# Patient Record
Sex: Male | Born: 1970
Health system: Southern US, Community
[De-identification: ages and names within clinical notes are randomized; demographics above are authoritative.]

## PROBLEM LIST (undated history)

## (undated) DIAGNOSIS — M549 Dorsalgia, unspecified: Secondary | ICD-10-CM

## (undated) DIAGNOSIS — R030 Elevated blood-pressure reading, without diagnosis of hypertension: Secondary | ICD-10-CM

## (undated) DIAGNOSIS — G8929 Other chronic pain: Secondary | ICD-10-CM

## (undated) HISTORY — DX: Dorsalgia, unspecified: M54.9

## (undated) HISTORY — DX: Other chronic pain: G89.29

## (undated) HISTORY — DX: Elevated blood-pressure reading, without diagnosis of hypertension: R03.0

---

## 1998-07-14 ENCOUNTER — Emergency Department (HOSPITAL_COMMUNITY): Admission: EM | Admit: 1998-07-14 | Discharge: 1998-07-14 | Payer: Self-pay | Admitting: Emergency Medicine

## 1998-07-14 ENCOUNTER — Encounter: Admission: RE | Admit: 1998-07-14 | Discharge: 1998-07-14 | Payer: Self-pay | Admitting: *Deleted

## 1998-07-15 ENCOUNTER — Encounter: Admission: RE | Admit: 1998-07-15 | Discharge: 1998-10-13 | Payer: Self-pay | Admitting: Internal Medicine

## 2005-09-15 ENCOUNTER — Ambulatory Visit: Payer: Self-pay | Admitting: Internal Medicine

## 2005-09-29 ENCOUNTER — Ambulatory Visit: Payer: Self-pay | Admitting: Internal Medicine

## 2006-06-16 ENCOUNTER — Ambulatory Visit: Payer: Self-pay | Admitting: Cardiology

## 2006-06-16 ENCOUNTER — Inpatient Hospital Stay (HOSPITAL_COMMUNITY): Admission: EM | Admit: 2006-06-16 | Discharge: 2006-06-17 | Payer: Self-pay | Admitting: Emergency Medicine

## 2006-07-02 ENCOUNTER — Encounter: Payer: Self-pay | Admitting: Cardiology

## 2006-07-02 ENCOUNTER — Ambulatory Visit: Payer: Self-pay

## 2006-08-02 ENCOUNTER — Ambulatory Visit: Payer: Self-pay | Admitting: Cardiology

## 2007-06-25 ENCOUNTER — Ambulatory Visit: Payer: Self-pay | Admitting: Cardiology

## 2011-05-09 NOTE — Assessment & Plan Note (Signed)
[facility]                            CARDIOLOGY OFFICE NOTE   Eric Rios, Eric Rios                         MRN:          [id number]  DATE:06/25/2007                            DOB:          05-02-1971    PRIMARY CARE:  [facility].   REASON FOR VISIT:  Followup atrial fibrillation.   HISTORY OF PRESENT ILLNESS:  I saw Eric Rios back in August of last  year.  He has a history of a single episode of atrial fibrillation with  rapid ventricular response following an intake of significant  caffeinated beverage.  He converted with flecainide during a hospital  observation, and has been followed on beta blocker therapy and aspirin  since that time.  His CHADS score is essentially zero.  He has not had  any furthers palpitations, states that he has been doing well, except  that he hurt his ankle playing basketball several months ago.  He has  been somewhat limited by this.  His electrocardiogram today shows normal  sinus rhythm at 76 beats per minute.   ALLERGIES:  No known drug allergies.   PRESENT MEDICATIONS:  1. Aspirin 81 mg 2 tablets p.o. daily.  2. Atenolol 50 mg p.o. daily.   REVIEW OF SYSTEMS:  As described in the history of present illness.   PHYSICAL EXAMINATION:  Blood pressure is 133/81, heart rate is 85,  weight is 265 pounds.  The patient is overweight, no acute distress.  Examination of the neck reveals no elevated jugular venous pressure,  loud bruits, no thyromegaly is noted.  LUNGS:  Clear without labored breathing at rest.  CARDIAC EXAM:  Reveals a regular rate and rhythm without loud murmur or  gallop.  EXTREMITIES:  Exhibit no significant pitting edema.   IMPRESSION:  1. Episode of atrial fibrillation with rapid ventricular response as      outlined.  He has had no obvious symptomatic recurrences of present      therapy, and we will plan to continue basic symptom observation      with a yearly visit.  I have  already spoken with him about diet,      weight loss, and avoiding caffeine use.  2. Otherwise continue regular followup with [facility].     Jonelle Sidle, MD  Electronically Signed    SGM/MedQ  DD: 06/25/2007  DT: 06/26/2007  Job #: [id number]

## 2011-05-12 NOTE — Discharge Summary (Signed)
NAME:  Eric Rios, Eric Rios NO.:  [id number]   MEDICAL RECORD NO.:  [id number]          PATIENT TYPE:  INP   LOCATION:  2902                         FACILITY:  [facility]   PHYSICIAN:  Gene Serpe, P.A. [facility]   DATE OF BIRTH:  Jun 08, 1971   DATE OF ADMISSION:  06/16/2006  DATE OF DISCHARGE:  06/17/2006                                 DISCHARGE SUMMARY   PRINCIPAL DIAGNOSES:  1.  Paroxysmal atrial fibrillation with rapid ventricular response.      Successfully chemically converted with flecainide.  2.  Preserved left ventricular function.   SECONDARY DIAGNOSES:  1.  History of palpitations.  2.  History of hypercholesterolemia.  3.  Obesity.   PROCEDURES:  None.   REASON FOR ADMISSION:  [name] is a 40 year old male, with no known  cardiac history, who presented to the emergency room with complaints of mild  exertional dyspnea. He initially presented to Urgent Care where he was found  to be in paroxysmal atrial fibrillation with RVR and was treated with  Diltiazem bolus prior to transfer.  He was subsequently transferred to [facility] ER for further evaluation and management.   HOSPITAL COURSE:  While in the emergency room, the patient was evaluated  with a bedside echocardiogram revealing preserved left ventricular function.  Dr. Andee Lineman thus recommended proceeding initially with rate control with  Lopressor 25 mg followed 30 minutes later by a 300 mg dose of flecainide.  The patient was subsequently successfully converted to a normal sinus rhythm  wherein her remained by time of discharge the following day.   The patient was thus cleared for discharge with recommendations to be  treated with Toprol XL 50 daily and aspirin.   The patient will need a followup formal 2D echocardiogram and aspirin.  Dr.  Andee Lineman did note that patient could be suitable candidate for treatment with  pill in the pocket in the future with flecainide, but recommended  deferring this  decision pending his followup.   Arrangements will be made for patient to establish with Dr. Nona Dell.  This will be preceded by formal 2D echocardiogram in our office in our  office.   LABORATORY DATA:  Normal CBC.  INR 1.0 on admission.  Sodium 138.  Potassium  3.9.  BUN 9.  Creatinine 0.9.  Glucose 91.  Normal liver enzymes.  One set  cardiac markers normal.  Lipid profile:  Total cholesterol 171,  triglycerides 170, HDL 34, LDL 103.  TSH 0.85.   Admission chest x-ray:  Probable small bilateral effusions with mild  cardiomegaly and vascular congestion.   DISCHARGE MEDICATIONS:  1.  Toprol XL 50 mg daily.  2.  Coated aspirin 325 mg daily.   INSTRUCTIONS:  The patient may return to work on Wednesday, June 20, 2006.   FOLLOWUP:  The patient will establish with Dr. Nona Dell in the office  in approximately 2 weeks.  This will be preceded by formal 2D echocardiogram  with arrangements to be made through our office.   DISPOSITION:  Stable.   DICTATION DURATION:  35 minutes.  Gene Serpe, P.A. [facility]     GS/MEDQ  D:  06/17/2006  T:  06/17/2006  Job:  [id number]   cc:   [facility]  [address] [place], [place] [address]

## 2011-05-12 NOTE — Assessment & Plan Note (Signed)
[facility]                              CARDIOLOGY OFFICE NOTE   AMERY, MINASYAN                         MRN:          [id number]  DATE:08/02/2006                            DOB:          1971/01/10    PRIMARY CARE PHYSICIAN:  [facility].   REASON FOR VISIT:  Follow up atrial fibrillation.   HISTORY OF PRESENT ILLNESS:  This is my first meeting with Mr. Heady.  He is  a pleasant 40 year old male admitted to the hospital by Dr. Andee Lineman back on  June 16, 2006, having presented with atrial fibrillation with rapid  ventricular response.  At that time, he was drinking approximately four to  five 20 ounce caffeinated beverages a day.  He was treated initially with  rate control, and ultimately received a single dose of Flecainide with  subsequent conversion to normal sinus rhythm.  He was placed on Toprol XL  and aspirin and discharged home on June 17, 2006.  His planned return to  work was June 20, 2006.  During his hospital stay, he had a normal TSH level  of 0.847, and a LDL cholesterol of 103.  His cardiac markers were normal.  He states that since discharge he has returned to work without any  difficulty and has completely cut out caffeinated beverages.  He is not  experiencing any symptoms of palpitations, breathlessness, or chest pain.  His electrocardiogram today was normal showing sinus rhythm at 66 beats per  minute.  We discussed atrial fibrillation today and I basically made him  aware of recurrent symptoms and encouraged him to consider a basic exercise  regimen, as well as continue to avoid caffeine and other stimulants.  He did  have a followup echocardiogram after discharge which revealed normal left  ventricular systolic function with an ejection fraction of 55 to 60%, no  regional wall motion abnormalities, and mildly dilated left atrium with no  major valvular disease.   ALLERGIES:  No known drug allergies.   PRESENT MEDICATIONS:  1. Aspirin 81 mg two tablets p.o. daily.  2. Atenolol 50 mg p.o. daily in lieu of the Toprol XL 50 mg daily which he      did not tolerate.   REVIEW OF SYSTEMS:  As described in history of present illness.  Otherwise  negative.   PHYSICAL EXAMINATION:  VITAL SIGNS:  Blood pressure 152/92, heart rate 82,  weight 259 pounds.  GENERAL:  The patient is obese, in no acute distress.  NECK:  No elevated jugular venous pressure, no bruits, no thyromegaly or  thyroid tenderness is noted.  LUNGS:  Clear without labored breathing.  CARDIAC:  Regular rate and rhythm without loud murmur or gallop.  There is  no abdominal bruit noted.  EXTREMITIES:  No pitting edema.   IMPRESSION AND RECOMMENDATIONS:  1. History of atrial fibrillation with rapid ventricular response.  He has      had no obvious symptomatic recurrence and will be maintained on beta      blocker therapy and aspirin for the time being.  His  left ventricular      function is normal and he has no major valvular disease, no thyroid      disease, diabetes mellitus, or other thrombo-embolic risk factors.  I      have encouraged him to adopt a basic exercise regimen, work on weight      loss, and avoid caffeine and other stimulants.  If he has recurrent      atrial fibrillation, we will likely need to consider a different      approach.  I will plan to see him back over the next six months with      anticipated followup otherwise via his primary care [facility].  2. Further plans to follow.                                Jonelle Sidle, MD    SGM/MedQ  DD:  08/02/2006  DT:  08/02/2006  Job #:  [id number]   cc:   [facility]

## 2011-05-12 NOTE — H&P (Signed)
NAME:  Eric Rios, Eric Rios NO.:  [id number]   MEDICAL RECORD NO.:  [id number]          PATIENT TYPE:  EMS   LOCATION:  MAJO                         FACILITY:  [facility]   PHYSICIAN:  Learta Codding, M.D. LHCDATE OF BIRTH:  08/16/1971   DATE OF ADMISSION:  06/16/2006  DATE OF DISCHARGE:                                HISTORY & PHYSICAL   REASON FOR ADMISSION:  Mr. Eric Rios is a 40 year old male with no prior cardiac  history who now presents to the ER with new onset paroxysmal atrial  fibrillation with rapid ventricular response.   The patient reports having some mild dyspnea which began approximately 6  p.m. yesterday.  This was after playing baseball with his son yesterday  afternoon, however, his dyspnea persisted throughout the night and when he  awoke this morning, he continued to feel bad.  Although he denied any  frank tachy palpitations, he does describe a sensation overlying his left  precordium and also points out history of fluttering which has occurred on  several occasions over the past several years.  However, he denies any  increase in frequency of this pattern.  The patient initially presented to  an urgent care clinic this morning, where he was found to be in PAF at a  rate of 128 BPM.  He was treated with 50 mg of IV diltiazem as well as 2  baby aspirin and sublingual nitroglycerin for complaints of chest pain.  He  was then transferred here via EMS.  On arrival, repeat EKG demonstrated  atrial fibrillation at a lower rate of 109 BPM.  Systolic blood pressure was  99 on arrival.  The patient reports being quite fond of chocolate and also  imbibes at least four 20-ounce containers of [facility] and does so on a  regular basis.  However, he denies drinking regular tea or coffee.   ALLERGIES:  No known drug allergies.   MEDICATIONS:  None.   PAST MEDICAL HISTORY:  1.  History of hypercholesterolemia.  2.  The patient reports undergoing a routine treadmill  test about 10 years      ago for evaluation of nonexertional chest pain with test reportedly      negative.   The patient denies any hospitalizations or surgeries.   SOCIAL HISTORY:  The patient lives in [place] with his wife.  They have 3  children.  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  He has  never smoked tobacco and denies alcohol use.   FAMILY HISTORY:  Mother in her 80s, has had a myocardial infarction in the  past in her 17s.  Father deceased age 61, succumbed to cancer.  He had no  known heart disease.  Three sisters, none of whom have heart disease.   REVIEW OF SYSTEMS:  The patient denies any exertional chest discomfort or  recent exertional dyspnea.  He can easily climb a flight of stairs with no  associated symptoms.  He denies orthopnea, paroxysmal nocturnal dyspnea, or  lower extremity edema.  He has had a few episodes of fluttering over  these  past few years with no significant associated symptoms.  He has occasional  heartburn but does not take medication for this.  He denies any recent  fever, chills, sweats, or productive cough.  Denies dysuria, hematuria,  hemoptysis, hematochezia, or melena.  He denies any history of thyroid  disease.  Remaining systems negative.   PHYSICAL EXAMINATION:  VITAL SIGNS:  Blood pressure 99/53 on admission,  temperature 98.7, pulse 80 irregular, respirations 22, sat is 98% on 2  liters.  GENERAL:  A 40 year old male in no apparent distress.  HEENT:  Normocephalic atraumatic.  NECK:  Palpable carotid pulses without bruits.  LUNGS:  Clear to auscultation all fields.  HEART:  Regular rate and rhythm (S1 S2).  No murmurs, rubs, or gallops.  ABDOMEN:  Protuberant, nontender with intact bowel sounds.  EXTREMITIES:  Palpable distal pulses without edema.  NEUROLOGIC:  No focal deficit.   ADMISSION CHEST X-RAY:  Pending.   ADMISSION ELECTROCARDIOGRAM:  Atrial fibrillation at 109 BPM with normal  axis, nonspecific  changes.   LABORATORY DATA:  WBC 10, hemoglobin 15, hematocrit 46, platelets 201.  MB  less than 1.0, troponin I less than 0.05.   IMPRESSION:  1.  Paroxysmal atrial fibrillation with a rapid ventricular response of less      than 24 hour duration.  2.  History of palpitations.  3.  Significant caffeine intake.  4.  History of hypercholesterolemia.  5.  Obesity.   PLAN:  1.  The patient will be admitted to the stepdown unit for close monitoring      and management of atrial fibrillation.  2.  We feel that the onset of his arrhythmia was at 6 p.m. last evening,      when he became short of breath and this has persisted since then.  We      will initiate medical therapy here in the ER with Lopressor 25 t.i.d.      with initial dose to be given now.  This will be followed 30 minutes      later by a 300 mg dose of flecainide with close monitoring for      development of arrhythmia.  Hopefully, this will convert him to normal      sinus rhythm and the patient could be discharged home in the morning.  A      bedside echocardiogram was done and reviewed by Dr. Andee Lineman suggesting      normal left ventricular function.  The patient will need a followup      formal 2D echocardiogram in our office.  Anticoagulation will be deferred unless the patient fails to convert and we  will place him on full dose aspirin here in the ER.  1.  In addition, to complete blood work, including a magnesium level, we      will also check a thyroid level and a fasting lipid profile for the      morning.  2.  The patient will be placed on a caffeine restricted diet as well.      Gene Serpe, P.A. [facility]      Learta Codding, M.D. [facility]  Electronically Signed    GS/MEDQ  D:  06/16/2006  T:  06/16/2006  Job:  [id number]   cc:   [facility]

## 2015-12-13 ENCOUNTER — Ambulatory Visit: Payer: Self-pay | Admitting: Primary Care

## 2016-01-03 ENCOUNTER — Ambulatory Visit: Payer: Self-pay | Admitting: Primary Care

## 2016-05-15 ENCOUNTER — Ambulatory Visit (INDEPENDENT_AMBULATORY_CARE_PROVIDER_SITE_OTHER): Payer: Managed Care, Other (non HMO) | Admitting: Primary Care

## 2016-05-15 ENCOUNTER — Other Ambulatory Visit: Payer: Self-pay | Admitting: Primary Care

## 2016-05-15 ENCOUNTER — Encounter: Payer: Self-pay | Admitting: Primary Care

## 2016-05-15 VITALS — BP 146/98 | HR 83 | Temp 98.3°F | Ht 67.0 in | Wt 321.8 lb

## 2016-05-15 DIAGNOSIS — Z0000 Encounter for general adult medical examination without abnormal findings: Secondary | ICD-10-CM | POA: Diagnosis not present

## 2016-05-15 DIAGNOSIS — R030 Elevated blood-pressure reading, without diagnosis of hypertension: Secondary | ICD-10-CM

## 2016-05-15 DIAGNOSIS — Z23 Encounter for immunization: Secondary | ICD-10-CM | POA: Diagnosis not present

## 2016-05-15 DIAGNOSIS — M545 Low back pain, unspecified: Secondary | ICD-10-CM | POA: Insufficient documentation

## 2016-05-15 DIAGNOSIS — R5383 Other fatigue: Secondary | ICD-10-CM | POA: Diagnosis not present

## 2016-05-15 DIAGNOSIS — E119 Type 2 diabetes mellitus without complications: Secondary | ICD-10-CM

## 2016-05-15 DIAGNOSIS — Z6841 Body Mass Index (BMI) 40.0 and over, adult: Secondary | ICD-10-CM

## 2016-05-15 DIAGNOSIS — Z0001 Encounter for general adult medical examination with abnormal findings: Secondary | ICD-10-CM | POA: Insufficient documentation

## 2016-05-15 DIAGNOSIS — IMO0001 Reserved for inherently not codable concepts without codable children: Secondary | ICD-10-CM | POA: Insufficient documentation

## 2016-05-15 LAB — COMPREHENSIVE METABOLIC PANEL
ALT: 18 U/L (ref 0–53)
AST: 18 U/L (ref 0–37)
Albumin: 4.1 g/dL (ref 3.5–5.2)
Alkaline Phosphatase: 61 U/L (ref 39–117)
BUN: 8 mg/dL (ref 6–23)
CALCIUM: 9.4 mg/dL (ref 8.4–10.5)
CHLORIDE: 99 meq/L (ref 96–112)
CO2: 32 meq/L (ref 19–32)
CREATININE: 0.8 mg/dL (ref 0.40–1.50)
GFR: 110.32 mL/min (ref >60.00)
Glucose, Bld: 127 mg/dL — ABNORMAL HIGH (ref 70–99)
Potassium: 4.1 mEq/L (ref 3.5–5.1)
Sodium: 137 mEq/L (ref 135–145)
Total Bilirubin: 0.4 mg/dL (ref 0.2–1.2)
Total Protein: 7.2 g/dL (ref 6.0–8.3)

## 2016-05-15 LAB — LIPID PANEL
Cholesterol: 175 mg/dL (ref 0–200)
HDL: 40.1 mg/dL (ref >39.00)
LDL CALC: 112 mg/dL — AB (ref 0–99)
NONHDL: 135.14
Total CHOL/HDL Ratio: 4
Triglycerides: 115 mg/dL (ref 0.0–149.0)
VLDL: 23 mg/dL (ref 0.0–40.0)

## 2016-05-15 LAB — CBC
HEMATOCRIT: 43 % (ref 39.0–52.0)
Hemoglobin: 13.7 g/dL (ref 13.0–17.0)
MCHC: 31.8 g/dL (ref 30.0–36.0)
MCV: 74 fl — AB (ref 78.0–100.0)
PLATELETS: 246 10*3/uL (ref 150.0–400.0)
RBC: 5.81 Mil/uL (ref 4.22–5.81)
RDW: 14.8 % (ref 11.5–15.5)
WBC: 7.4 10*3/uL (ref 4.0–10.5)

## 2016-05-15 LAB — HEMOGLOBIN A1C: Hgb A1c MFr Bld: 8.9 % — ABNORMAL HIGH (ref 4.6–6.5)

## 2016-05-15 LAB — VITAMIN B12: Vitamin B-12: 259 pg/mL (ref 211–911)

## 2016-05-15 LAB — TSH: TSH: 1.95 u[IU]/mL (ref 0.35–4.50)

## 2016-05-15 MED ORDER — METFORMIN HCL 1000 MG PO TABS: ORAL_TABLET | ORAL | Status: DC

## 2016-05-15 NOTE — Assessment & Plan Note (Signed)
Td due, completed today. Poor diet and does not exercise. Long discussion today spent on the importance of a healthy diet and regular exercise in order for weight loss and to reduce risk of other medical diseases. Labs pending today. Exam unremarkable.  Follow up in 1 year for complete physical.

## 2016-05-15 NOTE — Progress Notes (Signed)
Subjective:    Patient ID: Eric Rios, male    DOB: September 24, 1971, 45 y.o.   MRN: [id number]  HPI  Mr. Dugar is a 45 year old male who presents today to establish care, discuss problems mentioned below, and for complete physical.  Immunizations: -Tetanus: Unsure, believes it's been over 10 years. Due. -Influenza: He did not complete last season.  Diet: He endorses a poor diet. Breakfast: Fast food, soda Lunch: Fast food Dinner: Sandwich, meat, vegetable, starch Snacks: Chips, crackers, candy Desserts: Daily Beverages: Sodas, some water, orange juice  Exercise: He is not currently exercising.  Eye exam: He's not completed in several years. Dental exam: He's not completed in several years.  1) Back Pain: Located to the lower back, mostly to his left lower side. 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 His pain is worse with prolonged standing. He doesn't believe he's had an xray in recent years. He eats a poor diet and does not exercise.  2) Fatigue: Present everyday. Daytime tiredness and wants to sleep during his days off. He is getting 8 hours of sleep on average every night. He is obese, falls asleep at work, snore at night. He is concerned he may have low testosterone levels. Denies family history of low testosterone levels, denies family history of OSA.  3) Elevated Blood Pressure Reading: Elevated in the clinic today at 146/98. He's checking his BP at work and is getting readings of 140's/80's on average. Denies chest pain and dizziness. He will get shortness of breath when walking upstairs. He has a family history of hypertension with his father. He endorses a poor diet and does not exercise.    Review of Systems  Constitutional: Positive for fatigue. Negative for unexpected weight change.  HENT: Negative for rhinorrhea.   Respiratory: Negative for cough.   Cardiovascular: Negative for chest pain.  Gastrointestinal:  Negative for diarrhea and constipation.  Genitourinary: Negative for difficulty urinating.  Musculoskeletal: Negative for myalgias and arthralgias.  Skin: Negative for rash.  Allergic/Immunologic: Negative for environmental allergies.  Neurological: Negative for dizziness, numbness and headaches.  Psychiatric/Behavioral:       Denies concerns for anxiety or depression.        Past Medical History  Diagnosis Date  . Elevated blood pressure reading   . Chronic back pain      Social History   Social History  . Marital Status: Married    Spouse Name: N/A  . Number of Children: N/A  . Years of Education: N/A   Occupational History  . Not on file.   Social History Main Topics  . Smoking status: Never Smoker   . Smokeless tobacco: Not on file  . Alcohol Use: No  . Drug Use: No  . Sexual Activity: Not on file   Other Topics Concern  . Not on file   Social History Narrative   Married.   2 children.   Works as Lobbyist.   Enjoys playing basketball, spending time with family.     No past surgical history on file.  Family History  Problem Relation Age of Onset  . Hypertension Father   . Lung cancer Father     No Known Allergies  No current outpatient prescriptions on file prior to visit.   No current facility-administered medications on file prior to visit.    BP 146/98 mmHg  Pulse 83  Temp(Src) 98.3 F (36.8 C) (Oral)  Ht 5\' 7"  (1.702 m)  Wt 321 lb 12.8 oz (145.968 kg)  BMI 50.39 kg/m2  SpO2 94%    Objective:   Physical Exam  Constitutional: He is oriented to person, place, and time. He appears well-nourished.  Morbidly obese.  HENT:  Right Ear: Tympanic membrane and ear canal normal.  Left Ear: Tympanic membrane and ear canal normal.  Nose: Nose normal. Right sinus exhibits no maxillary sinus tenderness and no frontal sinus tenderness. Left sinus exhibits no maxillary sinus tenderness and no frontal sinus tenderness.  Mouth/Throat:  Oropharynx is clear and moist.  Eyes: Conjunctivae and EOM are normal. Pupils are equal, round, and reactive to light.  Neck: Neck supple. Carotid bruit is not present. No thyromegaly present.  Short neck  Cardiovascular: Normal rate, regular rhythm and normal heart sounds.   Pulmonary/Chest: Effort normal and breath sounds normal. He has no wheezes. He has no rales.  Abdominal: Soft. Bowel sounds are normal. There is no tenderness.  Musculoskeletal: Normal range of motion.  Neurological: He is alert and oriented to person, place, and time. He has normal reflexes. No cranial nerve deficit.  Skin: Skin is warm and dry.  Psychiatric: He has a normal mood and affect.          Assessment & Plan:

## 2016-05-15 NOTE — Assessment & Plan Note (Signed)
Present daily for years. Symptoms suggest this may likely be due to undiagnosed OSA. Referral to pulmonology placed for further evaluation. Will also check TSH, CBC, CMP, A1C, Lipids to rule out other metabolic causes.  Long discussion spent on the importance of a healthy diet and regular exercise as obesity places him at higher risk for OSA and other medical diseases.

## 2016-05-15 NOTE — Assessment & Plan Note (Signed)
Morbidly obese. Back pain to left side since lifting a 400+ pound man off of the ground several years ago. Straight leg raise negative. No radiculopathy. Likely due to weight, but will obtain xray for further evaluation. He is too large for our xray table, so he will have to get [facility] at [facility]. He verbalized understanding.

## 2016-05-15 NOTE — Patient Instructions (Signed)
You must complete your xray at [facility]. Stop by the front to learn where to go for this.  Complete lab work prior to leaving today. I will notify you of your results once received.   Your fatigue could be contributed to multiple causes; however, I suspect you have sleep apnea which is likely attributing. You will be contacted regarding your referral to Pulmonology for further evaluation of sleep apnea.  Please let us know if you have not heard back within one week.   I highly recommend you schedule an eye exam with a local optometrist.  Your blood pressure is slightly too high. This will come down with weight loss through improvements in your diet and exercise.  You MUST work to improve your diet. Obesity can place you at risk for diabetes, stroke, heart attack, and other chronic conditions.  Reduce: Sodas, fast food, junk food. Increase: Lean protein (baked/grilled), vegetables, whole grains.  You've been provided with a tetanus vaccination that will cover you for 10 years.  I will be in touch with you once I receive your lab and xray results.  It was a pleasure to meet you today! Please don't hesitate to call me with any questions. Welcome to [facility]!  DASH Eating Plan DASH stands for "Dietary Approaches to Stop Hypertension." The DASH eating plan is a healthy eating plan that has been shown to reduce high blood pressure (hypertension). Additional health benefits may include reducing the risk of type 2 diabetes mellitus, heart disease, and stroke. The DASH eating plan may also help with weight loss. WHAT DO I NEED TO KNOW ABOUT THE DASH EATING PLAN? For the DASH eating plan, you will follow these general guidelines:  Choose foods with a percent daily value for sodium of less than 5% (as listed on the food label).  Use salt-free seasonings or herbs instead of table salt or sea salt.  Check with your health care provider or pharmacist before using salt substitutes.  Eat  lower-sodium products, often labeled as "lower sodium" or "no salt added."  Eat fresh foods.  Eat more vegetables, fruits, and low-fat dairy products.  Choose whole grains. Look for the word "whole" as the first word in the ingredient list.  Choose fish and skinless chicken or [place] more often than red meat. Limit fish, poultry, and meat to 6 oz (170 g) each day.  Limit sweets, desserts, sugars, and sugary drinks.  Choose heart-healthy fats.  Limit cheese to 1 oz (28 g) per day.  Eat more home-cooked food and less restaurant, buffet, and fast food.  Limit fried foods.  Cook foods using methods other than frying.  Limit canned vegetables. If you do use them, rinse them well to decrease the sodium.  When eating at a restaurant, ask that your food be prepared with less salt, or no salt if possible. WHAT FOODS CAN I EAT? Seek help from a dietitian for individual calorie needs. Grains Whole grain or whole wheat bread. Brown rice. Whole grain or whole wheat pasta. Quinoa, bulgur, and whole grain cereals. Low-sodium cereals. Corn or whole wheat flour tortillas. Whole grain cornbread. Whole grain crackers. Low-sodium crackers. Vegetables Fresh or frozen vegetables (raw, steamed, roasted, or grilled). Low-sodium or reduced-sodium tomato and vegetable juices. Low-sodium or reduced-sodium tomato sauce and paste. Low-sodium or reduced-sodium canned vegetables.  Fruits All fresh, canned (in natural juice), or frozen fruits. Meat and Other Protein Products Ground beef (85% or leaner), grass-fed beef, or beef trimmed of fat. Skinless chicken or [place].  Ground chicken or [place]. Pork trimmed of fat. All fish and seafood. Eggs. Dried beans, peas, or lentils. Unsalted nuts and seeds. Unsalted canned beans. Dairy Low-fat dairy products, such as skim or 1% milk, 2% or reduced-fat cheeses, low-fat ricotta or cottage cheese, or plain low-fat yogurt. Low-sodium or reduced-sodium cheeses. Fats and  Oils Tub margarines without trans fats. Light or reduced-fat mayonnaise and salad dressings (reduced sodium). Avocado. Safflower, olive, or canola oils. Natural peanut or almond butter. Other Unsalted popcorn and pretzels. The items listed above may not be a complete list of recommended foods or beverages. Contact your dietitian for more options. WHAT FOODS ARE NOT RECOMMENDED? Grains White bread. White pasta. White rice. Refined cornbread. Bagels and croissants. Crackers that contain trans fat. Vegetables Creamed or fried vegetables. Vegetables in a cheese sauce. Regular canned vegetables. Regular canned tomato sauce and paste. Regular tomato and vegetable juices. Fruits Dried fruits. Canned fruit in light or heavy syrup. Fruit juice. Meat and Other Protein Products Fatty cuts of meat. Ribs, chicken wings, bacon, sausage, bologna, salami, chitterlings, fatback, hot dogs, bratwurst, and packaged luncheon meats. Salted nuts and seeds. Canned beans with salt. Dairy Whole or 2% milk, cream, half-and-half, and cream cheese. Whole-fat or sweetened yogurt. Full-fat cheeses or blue cheese. Nondairy creamers and whipped toppings. Processed cheese, cheese spreads, or cheese curds. Condiments Onion and garlic salt, seasoned salt, table salt, and sea salt. Canned and packaged gravies. Worcestershire sauce. Tartar sauce. Barbecue sauce. Teriyaki sauce. Soy sauce, including reduced sodium. Steak sauce. Fish sauce. Oyster sauce. Cocktail sauce. Horseradish. Ketchup and mustard. Meat flavorings and tenderizers. Bouillon cubes. Hot sauce. Tabasco sauce. Marinades. Taco seasonings. Relishes. Fats and Oils Butter, stick margarine, lard, shortening, ghee, and bacon fat. Coconut, palm kernel, or palm oils. Regular salad dressings. Other Pickles and olives. Salted popcorn and pretzels. The items listed above may not be a complete list of foods and beverages to avoid. Contact your dietitian for more  information. WHERE CAN I FIND MORE INFORMATION? National Heart, Lung, and Blood Institute: [employer]   This information is not intended to replace advice given to you by your health care provider. Make sure you discuss any questions you have with your health care provider.   Document Released: 11/30/2011 Document Revised: 01/01/2015 Document Reviewed: 10/15/2013 Elsevier Interactive Patient Education [employer]

## 2016-05-15 NOTE — Assessment & Plan Note (Signed)
Poor diet consisting of regular consumption of fast food and sodas. He does not exercise.  Long discussion spent on the importance of a healthy diet and regular exercise as obesity places him at higher risk for OSA, MI, CVA, and other medical diseases.  Information and handouts provided for improvements in diet. Will continue to monitor.

## 2016-05-15 NOTE — Progress Notes (Signed)
Pre visit review using our clinic review tool, if applicable. No additional management support is needed unless otherwise documented below in the visit note. 

## 2016-05-15 NOTE — Assessment & Plan Note (Signed)
Slightly above goal today, also with readings he's had at work. Discussed treatment options with him, and feel that it is reasonable for him to work on improvements in his diet to lower BP levels as they are only slightly above goal.  Will allow him 6 months to work on improvements, if no improvement will initiate treatment.

## 2016-08-07 ENCOUNTER — Ambulatory Visit (INDEPENDENT_AMBULATORY_CARE_PROVIDER_SITE_OTHER): Payer: Managed Care, Other (non HMO) | Admitting: Pulmonary Disease

## 2016-08-07 ENCOUNTER — Encounter (INDEPENDENT_AMBULATORY_CARE_PROVIDER_SITE_OTHER): Payer: Self-pay

## 2016-08-07 VITALS — BP 158/92 | HR 75

## 2016-08-07 DIAGNOSIS — G4710 Hypersomnia, unspecified: Secondary | ICD-10-CM | POA: Insufficient documentation

## 2016-08-07 DIAGNOSIS — G4733 Obstructive sleep apnea (adult) (pediatric): Secondary | ICD-10-CM

## 2016-08-07 DIAGNOSIS — E669 Obesity, unspecified: Secondary | ICD-10-CM | POA: Insufficient documentation

## 2016-08-07 NOTE — Progress Notes (Signed)
Subjective:    Patient ID: Eric Rios, male    DOB: 08/24/1971, 45 y.o.   MRN: [id number]  HPI   This is the case of Eric Rios, 45 y.o. Male, who was referred by Dr. Vernona RiegerKatherine Clark  in consultation regarding possible OSA.   As you very well know, patient is a non smoker. He is not dxed with asthma or copd. No history of CA or DVT.  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   Patient is here for worsening hypersomnia. Has had hypersomnia for 5 years, worse the last year. Hypersomnia affects his functionality. As mentioned, he works with machinery and sometimes in the afternoon, he gets sleepy. He has witnessed apneas, snoring, gasping, choking, unrefreshed sleep. He does frequent tossing and turning. During weekends, he would sometimes end up sleeping throughout the day and would still feels unrefreshed when he wakes up. Does sleep talking. No other abnormal behavior and sleep.    Review of Systems  Constitutional: Negative.  Negative for fever and unexpected weight change.  HENT: Positive for congestion, rhinorrhea and sneezing. Negative for dental problem, ear pain, nosebleeds, postnasal drip, sinus pressure, sore throat and trouble swallowing.   Eyes: Negative.  Negative for redness and itching.  Respiratory: Negative.  Negative for cough, chest tightness, shortness of breath and wheezing.   Cardiovascular: Negative.  Negative for palpitations and leg swelling.  Gastrointestinal: Negative.  Negative for nausea and vomiting.  Endocrine: Negative.   Genitourinary: Negative.  Negative for dysuria.  Musculoskeletal: Positive for arthralgias, back pain and neck pain. Negative for joint swelling.  Skin: Negative.  Negative for rash.  Allergic/Immunologic: Positive for environmental allergies.  Neurological: Positive for headaches.  Hematological: Negative.  Does not bruise/bleed easily.  Psychiatric/Behavioral: Negative.  Negative for dysphoric mood. The patient  is not nervous/anxious.    Past Medical History:  Diagnosis Date  . Chronic back pain   . Elevated blood pressure reading      Family History  Problem Relation Age of Onset  . Hypertension Father   . Lung cancer Father      No past surgical history on file. NO surgery.   Social History   Social History  . Marital status: Married    Spouse name: N/A  . Number of children: N/A  . Years of education: N/A   Occupational History  . Not on file.   Social History Main Topics  . Smoking status: Never Smoker  . Smokeless tobacco: Not on file  . Alcohol use No  . Drug use: No  . Sexual activity: Not on file   Other Topics Concern  . Not on file   Social History Narrative   Married.   2 children.   Works as Lobbyistfork lift driver.   Enjoys playing basketball, spending time with family.        [place]    No Known Allergies   Outpatient Medications Prior to Visit  Medication Sig Dispense Refill  . metFORMIN (GLUCOPHAGE) 1000 MG tablet Take 1/2 tablet by mouth twice daily for 2 weeks, then advance to 1 full tablet twice daily thereafter. (Patient not taking: Reported on 08/07/2016) 60 tablet 3   No facility-administered medications prior to visit.    No orders of the defined types were placed in this encounter.         Objective:   Physical Exam   Vitals:  Vitals:   08/07/16 1519  BP: (!) 158/92  Pulse: 75  SpO2: 94%    Constitutional/General:  Pleasant, well-nourished, well-developed, not in any distress,  Comfortably seating.  Well kempt  There is no height or weight on file to calculate BMI. Wt Readings from Last 3 Encounters:  05/15/16 (!) 321 lb 12.8 oz (146 kg)    Neck circumference: 19 inches  HEENT: Pupils equal and reactive to light and accommodation. Anicteric sclerae. Normal nasal mucosa.   No oral  lesions,  mouth clear,  oropharynx clear, no postnasal drip. (-) Oral thrush. No dental caries.  Airway - Mallampati class IV.   Neck:  No masses. Midline trachea. No JVD, (-) LAD. (-) bruits appreciated.  Respiratory/Chest: Grossly normal chest. (-) deformity. (-) Accessory muscle use.  Symmetric expansion. (-) Tenderness on palpation.  Resonant on percussion.  Diminished BS on both lower lung zones. (-) wheezing, crackles, rhonchi (-) egophony  Cardiovascular: Regular rate and  rhythm, heart sounds normal, no murmur or gallops, no peripheral edema  Gastrointestinal:  Normal bowel sounds. Soft, non-tender. No hepatosplenomegaly.  (-) masses.   Musculoskeletal:  Normal muscle tone. Normal gait.   Extremities: Grossly normal. (-) clubbing, cyanosis.  (-) edema  Skin: (-) rash,lesions seen.   Neurological/Psychiatric : alert, oriented to time, place, person. Normal mood and affect         Assessment & Plan:  Hypersomnia Patient is here for worsening hypersomnia. Has had hypersomnia for 5 years, worse the last year. Hypersomnia affects his functionality. As mentioned, he works with machinery and sometimes in the afternoon, he gets sleepy. He has witnessed apneas, snoring, gasping, choking, unrefreshed sleep. He does frequent tossing and turning. During weekends, he would sometimes end up sleeping throughout the day and would still feels unrefreshed when he wakes up. Does sleep talking. No other abnormal behavior and sleep.  ESS 21.  Hypersomnia affects his fxnality. He operates on fork lifts.    We discussed about the diagnosis of Obstructive Sleep Apnea (OSA) and implications of untreated OSA. We discussed about CPAP and BiPaP as possible treatment options.   We will schedule the patient for a sleep study. 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    Patient was instructed to call the office if he/she has not heard back from the office 1-2 weeks after the sleep study.   Patient was instructed to call the office if he/she is having issues with the PAP device.   We  discussed good sleep hygiene.   Patient was advised not to engage in activities requiring concentration and/or vigilance if he/she is sleepy.  Patient was advised not to drive if he/she is sleepy.   Anticipate no issues with cpap.  Advised pt not to operate on heavy equipment when sleepy. We will try to expedite getting sleep study and cpap machine. May end up needing bipap.    Obesity Weight reduction.     Thank you very much for letting me participate in this patient's care. Please do not hesitate to give me a call if you have any questions or concerns regarding the treatment plan.   Patient will follow up with me in 6-8 weeks.     Pollie MeyerJ. Angelo A. de Dios, MD  08/07/2016   5:10 PM Pulmonary and Critical Care Medicine [facility] Pager: [phone] Office: [phone], Fax: [phone]

## 2016-08-07 NOTE — Assessment & Plan Note (Addendum)
Patient is here for worsening hypersomnia. Has had hypersomnia for 5 years, worse the last year. Hypersomnia affects his functionality. As mentioned, he works with machinery and sometimes in the afternoon, he gets sleepy. He has witnessed apneas, snoring, gasping, choking, unrefreshed sleep. He does frequent tossing and turning. During weekends, he would sometimes end up sleeping throughout the day and would still feels unrefreshed when he wakes up. Does sleep talking. No other abnormal behavior and sleep.  ESS 21.  Hypersomnia affects his fxnality. He operates on fork lifts.    We discussed about the diagnosis of Obstructive Sleep Apnea (OSA) and implications of untreated OSA. We discussed about CPAP and BiPaP as possible treatment options.   We will schedule the patient for a sleep study. 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    Patient was instructed to call the office if he/she has not heard back from the office 1-2 weeks after the sleep study.   Patient was instructed to call the office if he/she is having issues with the PAP device.   We discussed good sleep hygiene.   Patient was advised not to engage in activities requiring concentration and/or vigilance if he/she is sleepy.  Patient was advised not to drive if he/she is sleepy.   Anticipate no issues with cpap.  Advised pt not to operate on heavy equipment when sleepy. We will try to expedite getting sleep study and cpap machine. May end up needing bipap.

## 2016-08-07 NOTE — Patient Instructions (Signed)
It was a pleasure taking care of you today!  We will schedule you to have a sleep study to determine if you have sleep apnea.   We will get a home sleep test.  You will be instructed to come back to the office to get an apparatus to sleep with overnight.  Once we have the apparatus, it will usually take us 1-2 weeks to read the study and get back at you with results of the test.  Please give us a call in 2 weeks after your study if you do not hear back from us.    If the sleep study is positive, we will order you a CPAP  machine.  Please call the office if you do NOT receive your machine in the next 1-2 weeks.   Please make sure you use your CPAP device everytime you sleep.  We will monitor the usage of your machine per your insurance requirement.  Your insurance company may take the machine from you if you are not using it regularly.   Please clean the mask, tubings, filter, water reservoir with soapy water every week.  Please use distilled water for the water reservoir.   Please call the office or your machine provider (DME company) if you are having issues with the device.   Return to clinic in 6-8 weeks.     

## 2016-08-07 NOTE — Assessment & Plan Note (Signed)
Weight reduction 

## 2016-08-21 ENCOUNTER — Ambulatory Visit: Payer: Managed Care, Other (non HMO) | Admitting: Primary Care

## 2016-08-21 DIAGNOSIS — G4733 Obstructive sleep apnea (adult) (pediatric): Secondary | ICD-10-CM | POA: Diagnosis not present

## 2016-08-22 ENCOUNTER — Telehealth: Payer: Self-pay | Admitting: Primary Care

## 2016-08-22 DIAGNOSIS — E119 Type 2 diabetes mellitus without complications: Secondary | ICD-10-CM

## 2016-08-22 NOTE — Telephone Encounter (Signed)
Patient did not come in for their appointment on [phone] for follow up .  Please let me know if patient needs to be contacted immediately for follow up or no follow up needed.

## 2016-08-22 NOTE — Telephone Encounter (Signed)
Please reschedule patient for follow up at his convenience. Also, I saw in his chart where he wasn't taking his Metformin, is that the case?

## 2016-08-23 ENCOUNTER — Telehealth: Payer: Self-pay | Admitting: Pulmonary Disease

## 2016-08-23 DIAGNOSIS — G4733 Obstructive sleep apnea (adult) (pediatric): Secondary | ICD-10-CM

## 2016-08-23 NOTE — Telephone Encounter (Signed)
[phone], pt cb

## 2016-08-23 NOTE — Telephone Encounter (Signed)
Noted  

## 2016-08-23 NOTE — Telephone Encounter (Signed)
Spoke with pt. He is aware of results. Order has been placed for CPAP. Pt will call back once he has started CPAP to make his ROV. Nothing further was needed.

## 2016-08-23 NOTE — Telephone Encounter (Signed)
    Please call the pt and tell the pt the HOME SLEEP STUDY  showed OSA.   Pt stops breathing  90  times an hour.   Home sleep study was done on : 08/21/16  Please order autoCPAP 5-15 cm H2O. Patient will need a mask fitting session. Patient will need a 2 week and a 1 month download.   Patient needs to be seen by me or any of the NPs/APPs  4-6 weeks after obtaining the cpap machine. Let me know if you receive this.   Thanks!   J. Alexis FrockAngelo A de Dios, MD 08/23/2016, 2:04 PM

## 2016-08-23 NOTE — Telephone Encounter (Signed)
LMTCB

## 2016-08-23 NOTE — Telephone Encounter (Signed)
Called and did get a hold of patient's spouse. Patient's spouse stated patient must have forgotten that he had two appointments on Monday. Patient's spouse will have patient call and reschedule appointment.  Patient's spouse stated that she believes patient is not taking anything. She will have patient call.

## 2016-08-24 ENCOUNTER — Telehealth: Payer: Self-pay | Admitting: Pulmonary Disease

## 2016-08-24 NOTE — Telephone Encounter (Signed)
Sp[facility] with [place] at [facility]. He is needing a copy of the pt's home sleep study. Johny DrillingChan had a copy of this test. It has been faxed to [place] at [phone]. Barbara CowerJason is aware of this. Nothing further was needed.

## 2016-08-25 ENCOUNTER — Other Ambulatory Visit: Payer: Self-pay | Admitting: *Deleted

## 2016-08-25 DIAGNOSIS — G4733 Obstructive sleep apnea (adult) (pediatric): Secondary | ICD-10-CM

## 2016-10-12 ENCOUNTER — Encounter: Payer: Self-pay | Admitting: Pulmonary Disease

## 2016-10-16 ENCOUNTER — Ambulatory Visit (INDEPENDENT_AMBULATORY_CARE_PROVIDER_SITE_OTHER): Payer: Managed Care, Other (non HMO) | Admitting: Pulmonary Disease

## 2016-10-16 ENCOUNTER — Encounter: Payer: Self-pay | Admitting: Pulmonary Disease

## 2016-10-16 DIAGNOSIS — G4733 Obstructive sleep apnea (adult) (pediatric): Secondary | ICD-10-CM | POA: Diagnosis not present

## 2016-10-16 NOTE — Addendum Note (Signed)
Addended by: Maisie FusGREEN, Lelia Jons M on: 10/16/2016 09:23 AM   Modules accepted: Orders

## 2016-10-16 NOTE — Assessment & Plan Note (Signed)
Home sleep test done August/28/2017: AHI 90. Auto CPAP 5-15 centimeters water. Started 08/2016.  Feels better using CPAP, more energy, less sleepiness. Some sleepiness but better. Feels benefit of cpap.  He operates a Chief Executive Officerforklift. Download the last month: 87%, AHI 10.  Plan :  We extensively discussed the importance of treating OSA and the need to use PAP therapy.   Continue with autocpap > will increase pressure to 5-20 cm water from 5-15 cm water as AHI still elevated at 10. If pt does not tolerate, he will call and mayebe will need a BipaP study if AHI does not improve in 2-3 months.    Patient was instructed to have mask, tubings, filter, reservoir cleaned at least once a week with soapy water.  Patient was instructed to call the office if he/she is having issues with the PAP device.    I advised patient to obtain sufficient amount of sleep --  7 to 8 hours at least in a 24 hr period.  Patient was advised to follow good sleep hygiene.  Patient was advised NOT to engage in activities requiring concentration and/or vigilance if he/she is and  sleepy.  Patient is NOT to drive if he/she is sleepy.

## 2016-10-16 NOTE — Progress Notes (Signed)
Subjective:    Patient ID: Eric Rios, male    DOB: [birth year], 45 y.o.   MRN: [id number]  HPI   This is the case of Eric ShipGeorge Rahal, 45 y.o. Male, who was referred by Dr. Vernona RiegerKatherine Clark  in consultation regarding possible OSA.   As you very well know, patient is a non smoker. He is not dxed with asthma or copd. No history of CA or DVT.  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   Patient is here for worsening hypersomnia. Has had hypersomnia for 5 years, worse the last year. Hypersomnia affects his functionality. As mentioned, he works with machinery and sometimes in the afternoon, he gets sleepy. He has witnessed apneas, snoring, gasping, choking, unrefreshed sleep. He does frequent tossing and turning. During weekends, he would sometimes end up sleeping throughout the day and would still feels unrefreshed when he wakes up. Does sleep talking. No other abnormal behavior and sleep.  ROV 10/16/16 Pt returns to office as follow-up on his sleep apnea. Since last seen, he had a home sleep test which showed AHI of 90 hour. He is on auto CPAP. Tolerating well. Feels better using it. More energy. Less sleepiness.  Review of Systems  Constitutional: Negative.  Negative for fever and unexpected weight change.  HENT: Positive for congestion, rhinorrhea and sneezing. Negative for dental problem, ear pain, nosebleeds, postnasal drip, sinus pressure, sore throat and trouble swallowing.   Eyes: Negative.  Negative for redness and itching.  Respiratory: Negative.  Negative for cough, chest tightness, shortness of breath and wheezing.   Cardiovascular: Negative.  Negative for palpitations and leg swelling.  Gastrointestinal: Negative.  Negative for nausea and vomiting.  Endocrine: Negative.   Genitourinary: Negative.  Negative for dysuria.  Musculoskeletal: Positive for arthralgias, back pain and neck pain. Negative for joint swelling.  Skin: Negative.  Negative for rash.    Allergic/Immunologic: Positive for environmental allergies.  Neurological: Positive for headaches.  Hematological: Negative.  Does not bruise/bleed easily.  Psychiatric/Behavioral: Negative.  Negative for dysphoric mood. The patient is not nervous/anxious.      Objective:   Physical Exam   Vitals:  Vitals:   10/16/16 0854  BP: 126/84  Pulse: 85  SpO2: 96%  Weight: (!) 324 lb 6.4 oz (147.1 kg)  Height: 5\' 7"  (1.702 m)    Constitutional/General:  Pleasant, well-nourished, well-developed, not in any distress,  Comfortably seating.  Well kempt  Body mass index is 50.81 kg/m. Wt Readings from Last 3 Encounters:  10/16/16 (!) 324 lb 6.4 oz (147.1 kg)  05/15/16 (!) 321 lb 12.8 oz (146 kg)    Neck circumference: 19 inches  HEENT: Pupils equal and reactive to light and accommodation. Anicteric sclerae. Normal nasal mucosa.   No oral  lesions,  mouth clear,  oropharynx clear, no postnasal drip. (-) Oral thrush. No dental caries.  Airway - Mallampati class IV.   Neck: No masses. Midline trachea. No JVD, (-) LAD. (-) bruits appreciated.  Respiratory/Chest: Grossly normal chest. (-) deformity. (-) Accessory muscle use.  Symmetric expansion. (-) Tenderness on palpation.  Resonant on percussion.  Diminished BS on both lower lung zones. (-) wheezing, crackles, rhonchi (-) egophony  Cardiovascular: Regular rate and  rhythm, heart sounds normal, no murmur or gallops, no peripheral edema  Gastrointestinal:  Normal bowel sounds. Soft, non-tender. No hepatosplenomegaly.  (-) masses.   Musculoskeletal:  Normal muscle tone. Normal gait.   Extremities: Grossly normal. (-) clubbing, cyanosis.  (-) edema  Skin: (-) rash,lesions seen.   Neurological/Psychiatric : alert, oriented to time, place, person. Normal mood and affect         Assessment & Plan:  OSA (obstructive sleep apnea) Home sleep test done August/28/2017: AHI 90. Auto CPAP 5-15 centimeters water. Started  08/2016.  Feels better using CPAP, more energy, less sleepiness. Some sleepiness but better. Feels benefit of cpap.  He operates a [employer]. Download the last month: 87%, AHI 10.  Plan :  We extensively discussed the importance of treating OSA and the need to use PAP therapy.   Continue with autocpap > will increase pressure to 5-20 cm water from 5-15 cm water as AHI still elevated at 10. If pt does not tolerate, he will call and mayebe will need a BipaP study if AHI does not improve in 2-3 months.    Patient was instructed to have mask, tubings, filter, reservoir cleaned at least once a week with soapy water.  Patient was instructed to call the office if he/she is having issues with the PAP device.    I advised patient to obtain sufficient amount of sleep --  7 to 8 hours at least in a 24 hr period.  Patient was advised to follow good sleep hygiene.  Patient was advised NOT to engage in activities requiring concentration and/or vigilance if he/she is and  sleepy.  Patient is NOT to drive if he/she is sleepy.   Obesity Weight reduction.   Return to clinic in 4 months.        Pollie Meyer, MD  10/16/2016   9:16 AM Pulmonary and Critical Care Medicine [facility] Pager: [phone] Office: [phone], Fax: [phone]

## 2016-10-16 NOTE — Patient Instructions (Signed)
  It was a pleasure taking care of you today!  Continue using your CPAP machine.  We will increase your cpap settings to 5-20 cm water. Please let us know if you do not tolerate this.  Please make sure you use your CPAP device everytime you sleep.  We will monitor the usage of your machine per your insurance requirement.  Your insurance company may take the machine from you if you are not using it regularly.   Please clean the mask, tubings, filter, water reservoir with soapy water every week.  Please use distilled water for the water reservoir.   Please call the office or your machine provider (DME company) if you are having issues with the device.   Return to clinic in 4 months.

## 2016-10-16 NOTE — Assessment & Plan Note (Signed)
Weight reduction 

## 2016-11-02 ENCOUNTER — Encounter: Payer: Self-pay | Admitting: Pulmonary Disease

## 2016-11-08 ENCOUNTER — Telehealth: Payer: Self-pay | Admitting: Pulmonary Disease

## 2016-11-08 NOTE — Telephone Encounter (Signed)
  DL from 16/1010/11- 96/0411/09 : 54%97%. AHI 8.2 from 10. We increased autocpap 5-20 from 5-15 cm water early Oct 2017 as AHI was 10.  Last 2 weeks DL > AHI < 10. Cont autocpap 5-20 cm water. Will get a DL for 09/811911/2017.  Observe trend.  If worse, will need bipap study.  Ashtyn : can we request for a DL for 1 month in 14/782911/2017?  Pls call pt and ask him if he is tolerating the autocpap 5-20 cm water. Let me know if he does not tolerate pressure.  Thanks.   Pollie MeyerJ. Angelo A de Dios, MD 11/08/2016, 8:40 AM [facility] Pager [phone] After 3 pm or if no answer, call [phone]

## 2016-11-08 NOTE — Telephone Encounter (Signed)
LMOMTCB x 1 

## 2016-11-13 NOTE — Telephone Encounter (Signed)
LMOM TCB x2

## 2016-11-20 ENCOUNTER — Telehealth: Payer: Self-pay | Admitting: Pulmonary Disease

## 2016-11-20 NOTE — Telephone Encounter (Signed)
LM x 1 

## 2016-11-20 NOTE — Telephone Encounter (Signed)
LMOM TCB x3  Unable to reach letter will be mailed if no call back is received today

## 2016-11-22 ENCOUNTER — Encounter: Payer: Self-pay | Admitting: Pulmonary Disease

## 2016-11-22 NOTE — Telephone Encounter (Signed)
lmomtcb x 2  

## 2016-11-23 NOTE — Telephone Encounter (Signed)
Unable to reach letter is placed

## 2016-11-24 NOTE — Telephone Encounter (Signed)
lmomtcb x 3  

## 2016-11-30 ENCOUNTER — Telehealth: Payer: Self-pay | Admitting: Pulmonary Disease

## 2016-11-30 NOTE — Telephone Encounter (Signed)
   Download for October 31 until 11/22/2016: 93%, AHI 7. On auto CPAP 5-20 centimeters water.  AHI trending down. Plan for a DL in Jan [id number].  Jasmine: Can  we request for a download in January 2018? Thanks.  Pollie MeyerJ. Angelo A de Dios, MD 11/30/2016, 1:17 PM  Pulmonary and Critical Care Pager [phone] After 3 pm or if no answer, call [phone]

## 2016-12-02 ENCOUNTER — Encounter: Payer: Self-pay | Admitting: Pulmonary Disease

## 2016-12-06 NOTE — Telephone Encounter (Signed)
[place] so I can do that. A reminder has been set. Nothing further at this time is needed

## 2016-12-22 ENCOUNTER — Telehealth: Payer: Self-pay | Admitting: Pulmonary Disease

## 2016-12-22 NOTE — Telephone Encounter (Signed)
lmom tcb x1  Please see AD message about his DL for his cpap

## 2016-12-22 NOTE — Telephone Encounter (Signed)
   DL for 1 month starting 57/8411/10 : 93%, AHI 6.3.  autocpap 5-20.  pls tell pt cpap is working. pls get a DL for Feb 2018 for 1 month. Thanks.  Pollie MeyerJ. Angelo A de Dios, MD 12/22/2016, 9:18 AM [facility] Pager [phone] After 3 pm or if no answer, call [phone]

## 2016-12-26 NOTE — Telephone Encounter (Signed)
lmomtcb x 2  

## 2017-01-03 NOTE — Telephone Encounter (Signed)
lmom tcb x3   Jose Alexis FrockAngelo A de Dios, MD 12 days ago      DL for 1 month starting 91/4711/10 : 93%, AHI 6.3.  autocpap 5-20.  pls tell pt cpap is working. pls get a DL for Feb 2018 for 1 month. Thanks.  Pollie MeyerJ. Angelo A de Dios, MD 12/22/2016, 9:18 AM [facility] Pager [phone] After 3 pm or if no answer, call [phone]

## 2017-01-04 NOTE — Telephone Encounter (Signed)
Unable to reach letter sent

## 2017-01-26 ENCOUNTER — Ambulatory Visit (INDEPENDENT_AMBULATORY_CARE_PROVIDER_SITE_OTHER): Payer: Managed Care, Other (non HMO) | Admitting: Internal Medicine

## 2017-01-26 ENCOUNTER — Encounter: Payer: Self-pay | Admitting: Internal Medicine

## 2017-01-26 VITALS — BP 136/84 | HR 94 | Temp 98.2°F | Wt 314.0 lb

## 2017-01-26 DIAGNOSIS — J111 Influenza due to unidentified influenza virus with other respiratory manifestations: Secondary | ICD-10-CM | POA: Diagnosis not present

## 2017-01-26 NOTE — Progress Notes (Signed)
Pre visit review using our clinic review tool, if applicable. No additional management support is needed unless otherwise documented below in the visit note. 

## 2017-01-26 NOTE — Assessment & Plan Note (Signed)
Clear clinical flu  4 days out and already improving Too late for tamiflu (gave Rx for prevention for wife) Discussed supportive care

## 2017-01-26 NOTE — Progress Notes (Signed)
   Subjective:    Patient ID: Eric Rios, male    DOB: 1971-12-21, 46 y.o.   MRN: [id number]  HPI Here due to respiratory illness  48 year old daughter diagnosed with flu 4 days ago Started with some head congestion, headache and rhinorrhea 3 days ago 2 days ago--got really bad. Couldn't even move out of bed Finally got here today--1st time he has been out of bed Still some headache, congestion, cough now Feels considerably better Fever gone over the past day Was terribly achy--but better now No SOB  No current outpatient prescriptions on file prior to visit.   No current facility-administered medications on file prior to visit.     No Known Allergies  Past Medical History:  Diagnosis Date  . Chronic back pain   . Elevated blood pressure reading     No past surgical history on file.  Family History  Problem Relation Age of Onset  . Hypertension Father   . Lung cancer Father     Social History   Social History  . Marital status: Married    Spouse name: N/A  . Number of children: N/A  . Years of education: N/A   Occupational History  . Not on file.   Social History Main Topics  . Smoking status: Never Smoker  . Smokeless tobacco: Never Used  . Alcohol use No  . Drug use: No  . Sexual activity: Not on file   Other Topics Concern  . Not on file   Social History Narrative   Married.   2 children.   Works as Lobbyistfork lift driver.   Enjoys playing basketball, spending time with family.    Review of Systems No rash 2 loose stools last night Eating some again--appetite had been off    Objective:   Physical Exam  Constitutional: No distress.  Looks washed out but no distress  HENT:  No sinus tenderness Mild pharyngeal injection without exudate Mild nasal inflammation TMs normal  Neck: No thyromegaly present.  Mild non tender anterior cervical nodes  Pulmonary/Chest: Effort normal and breath sounds normal. No respiratory distress. He has no wheezes.  He has no rales.  Skin: No rash noted.          Assessment & Plan:

## 2017-01-31 ENCOUNTER — Telehealth: Payer: Self-pay | Admitting: Pulmonary Disease

## 2017-01-31 NOTE — Telephone Encounter (Signed)
   CPAP download the last month: Auto CPAP 5-20 centimeters water, 73% compliance, AHI 5.  Cont current cpap. Ps tell pt he is doing well with cpap.   Pollie MeyerJ. Angelo A de Dios, MD 01/31/2017, 5:39 PM [facility] Pager [phone] After 3 pm or if no answer, call [phone]

## 2017-02-05 NOTE — Telephone Encounter (Signed)
Spoke with pt. And made him aware of his results per AD. Pt. Had a follow up appointment in March but stated he needed to push it to April. A new appointment was made. Nothing further is needed at this time.

## 2017-03-05 ENCOUNTER — Ambulatory Visit: Payer: Managed Care, Other (non HMO) | Admitting: Pulmonary Disease

## 2017-03-26 ENCOUNTER — Ambulatory Visit: Payer: Managed Care, Other (non HMO) | Admitting: Pulmonary Disease

## 2017-05-10 ENCOUNTER — Encounter: Payer: Self-pay | Admitting: Pulmonary Disease

## 2017-06-11 ENCOUNTER — Ambulatory Visit: Payer: Managed Care, Other (non HMO) | Admitting: Pulmonary Disease

## 2017-08-30 ENCOUNTER — Encounter: Payer: Self-pay | Admitting: Pulmonary Disease

## 2019-03-26 ENCOUNTER — Telehealth: Payer: Self-pay | Admitting: Primary Care

## 2019-03-26 NOTE — Telephone Encounter (Signed)
Lvm asking pt to call office. He needs a follow up with labs. Okay to make this webex if he has capability.

## 2019-08-16 ENCOUNTER — Emergency Department (HOSPITAL_COMMUNITY)
Admission: EM | Admit: 2019-08-16 | Discharge: 2019-08-16 | Disposition: A | Discharge status: Home / Self Care | Payer: Managed Care, Other (non HMO) | Attending: Emergency Medicine | Admitting: Emergency Medicine

## 2019-08-16 ENCOUNTER — Other Ambulatory Visit: Payer: Self-pay

## 2019-08-16 ENCOUNTER — Encounter (HOSPITAL_COMMUNITY): Payer: Self-pay | Admitting: *Deleted

## 2019-08-16 ENCOUNTER — Emergency Department (HOSPITAL_COMMUNITY): Payer: Managed Care, Other (non HMO)

## 2019-08-16 DIAGNOSIS — M546 Pain in thoracic spine: Secondary | ICD-10-CM

## 2019-08-16 DIAGNOSIS — R0789 Other chest pain: Secondary | ICD-10-CM | POA: Insufficient documentation

## 2019-08-16 DIAGNOSIS — J8410 Pulmonary fibrosis, unspecified: Secondary | ICD-10-CM | POA: Insufficient documentation

## 2019-08-16 LAB — CBC
HCT: 48.4 % (ref 39.0–52.0)
Hemoglobin: 15.9 g/dL (ref 13.0–17.0)
MCH: 26.8 pg (ref 26.0–34.0)
MCHC: 32.9 g/dL (ref 30.0–36.0)
MCV: 81.6 fL (ref 80.0–100.0)
Platelets: 257 10*3/uL (ref 150–400)
RBC: 5.93 MIL/uL — ABNORMAL HIGH (ref 4.22–5.81)
RDW: 12.9 % (ref 11.5–15.5)
WBC: 13.3 10*3/uL — ABNORMAL HIGH (ref 4.0–10.5)
nRBC: 0 % (ref 0.0–0.2)

## 2019-08-16 LAB — BASIC METABOLIC PANEL
Anion gap: 13 (ref 5–15)
BUN: 13 mg/dL (ref 6–20)
CO2: 23 mmol/L (ref 22–32)
Calcium: 9.7 mg/dL (ref 8.9–10.3)
Chloride: 97 mmol/L — ABNORMAL LOW (ref 98–111)
Creatinine, Ser: 0.98 mg/dL (ref 0.61–1.24)
GFR calc Af Amer: >60 mL/min (ref >60)
GFR calc non Af Amer: >60 mL/min (ref >60)
Glucose, Bld: 387 mg/dL — ABNORMAL HIGH (ref 70–99)
Potassium: 4.8 mmol/L (ref 3.5–5.1)
Sodium: 133 mmol/L — ABNORMAL LOW (ref 135–145)

## 2019-08-16 LAB — TROPONIN I (HIGH SENSITIVITY)
Troponin I (High Sensitivity): 3 ng/L (ref <18)
Troponin I (High Sensitivity): 3 ng/L (ref <18)

## 2019-08-16 MED ORDER — IOHEXOL 350 MG/ML SOLN
100.0000 mL | Freq: Once | INTRAVENOUS | Status: AC | PRN
  Administered 2019-08-16: 100 mL via INTRAVENOUS

## 2019-08-16 MED ORDER — ONDANSETRON HCL 4 MG/2ML IJ SOLN
4.0000 mg | Freq: Once | INTRAMUSCULAR | Status: AC
  Administered 2019-08-16: 4 mg via INTRAVENOUS
  Filled 2019-08-16: qty 2

## 2019-08-16 MED ORDER — MORPHINE SULFATE (PF) 4 MG/ML IV SOLN
4.0000 mg | Freq: Once | INTRAVENOUS | Status: AC
  Administered 2019-08-16: 08:00:00 4 mg via INTRAVENOUS
  Filled 2019-08-16: qty 1

## 2019-08-16 MED ORDER — SODIUM CHLORIDE 0.9% FLUSH
3.0000 mL | Freq: Once | INTRAVENOUS | Status: AC
  Administered 2019-08-16: 3 mL via INTRAVENOUS

## 2019-08-16 MED ORDER — HYDROCODONE-ACETAMINOPHEN 5-325 MG PO TABS: 1.0000 | ORAL_TABLET | Freq: Three times a day (TID) | ORAL | 0 refills | Status: DC | PRN

## 2019-08-16 MED ORDER — SODIUM CHLORIDE (PF) 0.9 % IJ SOLN
INTRAMUSCULAR | Status: AC
  Filled 2019-08-16: qty 50

## 2019-08-16 NOTE — ED Triage Notes (Signed)
Pt went to doctor yesterday with back pain under left shoulder area, he moved heavy things on Wed. Today is radiating to left chest and left arm. Describes as achy, in chest but pain in back feels like a stabbing sensaton going into his neck.

## 2019-08-16 NOTE — ED Provider Notes (Signed)
[facility]-EMERGENCY DEPT Provider Note   CSN: [id number] Arrival date & time: 08/16/19  [id number]     History   Chief Complaint Chief Complaint  Patient presents with   Back Pain   Chest Pain    HPI Eric Rios is a 48 y.o. male who presents for evaluation of left-sided chest pain that began about 4 AM this morning.  Patient states that over the last 2 days, he started having some left-sided back pain that went into his posterior scapula.  He states that he thought that this was due to some heavy lifting he had done the day prior.  He reports that he had lifted several heavy gas tanks that weighed approximately 85-100 pounds.  He was seen at urgent care yesterday for evaluation of symptoms where he was given prednisone and right muscle relaxers to help with pain.  He states that he took a muscle relaxer yesterday but felt like it only made him sleepy and did not improve his pain.  He states that about 4 AM this morning, he had some left-sided chest pain that he described as a "fluttering type sensation."  He also reports that his left-sided back pain worsened.  He feels like the pain in his back and his chest are connected and states that radiates to his left upper extremity and his left neck.  He states he has not gotten diaphoretic, nauseous, vomiting or had of difficulty breathing with pain.  He states like his pain is slightly worsened when he stands up but is not worse with exertion or deep inspiration.  He states that the chest pain has been intermittent but he cannot think of anything that will make it occur.  He has not taken any medications today for the pain.  He denies any recent sicknesses, fevers, cough.  He denies any abdominal pain, numbness/weakness of his arms or legs, vision changes.  Patient states he is not a current smoker.  He denies any history of high blood pressure or diabetes.  He states he has no personal cardiac history.  He denies any family history  of heart attacks before the age of 50.  HPI: A 48 year old patient with a history of obesity presents for evaluation of chest pain. Initial onset of pain was approximately 1-3 hours ago. The patient's chest pain is sharp and is not worse with exertion. The patient's chest pain is middle- or left-sided, is not well-localized, is not described as heaviness/pressure/tightness and does radiate to the arms/jaw/neck. The patient does not complain of nausea and denies diaphoresis. The patient has no history of stroke, has no history of peripheral artery disease, has not smoked in the past 90 days, denies any history of treated diabetes, has no relevant family history of coronary artery disease (first degree relative at less than age 50), is not hypertensive and has no history of hypercholesterolemia.   The history is provided by the patient.    Past Medical History:  Diagnosis Date   Chronic back pain    Elevated blood pressure reading     Patient Active Problem List   Diagnosis Date Noted   Influenza 01/26/2017   OSA (obstructive sleep apnea) 10/16/2016   Hypersomnia 08/07/2016   Obesity 08/07/2016   Encounter for preventative adult health care exam with abnormal findings 05/15/2016   Left-sided low back pain without sciatica 05/15/2016   Other fatigue 05/15/2016   Elevated blood pressure 05/15/2016   Morbid obesity with BMI of 50.0-59.9, adult (HCC) 05/15/2016  History reviewed. No pertinent surgical history.      Home Medications    Prior to Admission medications   Medication Sig Start Date End Date Taking? Authorizing Provider  ibuprofen (ADVIL) 200 MG tablet Take 400 mg by mouth every 6 (six) hours as needed for moderate pain.   Yes [provider]  predniSONE (DELTASONE) 10 MG tablet Take 10-60 mg by mouth See admin instructions. Take 6 pills on day 1, 5 on day 2, 4 on day 3, 3 on day 4, 2 on day 5 and 1 on day 6. Do not take mobic with prednisone. 08/15/19  08/19/19 Yes [provider]  tiZANidine (ZANAFLEX) 4 MG tablet Take 1-4 mg by mouth 4 (four) times daily. 08/15/19  Yes [provider]  HYDROcodone-acetaminophen (NORCO/VICODIN) 5-325 MG tablet Take 1-2 tablets by mouth every 8 (eight) hours as needed. 08/16/19   Maxwell Caul, PA-C    Family History Family History  Problem Relation Age of Onset   Hypertension Father    Lung cancer Father     Social History Social History   Tobacco Use   Smoking status: Never Smoker   Smokeless tobacco: Never Used  Substance Use Topics   Alcohol use: No    Alcohol/week: 0.0 standard drinks   Drug use: No     Allergies   Patient has no known allergies.   Review of Systems Review of Systems  Constitutional: Negative for fever.  Eyes: Negative for visual disturbance.  Respiratory: Negative for cough and shortness of breath.   Cardiovascular: Positive for chest pain.  Gastrointestinal: Negative for abdominal pain, nausea and vomiting.  Genitourinary: Negative for dysuria and hematuria.  Musculoskeletal: Positive for back pain.  Neurological: Negative for weakness, numbness and headaches.  All other systems reviewed and are negative.    Physical Exam Updated Vital Signs BP 138/66    Pulse 82    Temp 98.4 F (36.9 C) (Oral)    Resp 19    SpO2 90%   Physical Exam Vitals signs and nursing note reviewed.  Constitutional:      Appearance: Normal appearance. He is well-developed.     Comments: Sitting comfortably on examination table  HENT:     Head: Normocephalic and atraumatic.  Eyes:     General: Lids are normal.     Conjunctiva/sclera: Conjunctivae normal.     Pupils: Pupils are equal, round, and reactive to light.     Comments: PERRL. EOMs intact. No nystagmus. No neglect.   Neck:     Musculoskeletal: Full passive range of motion without pain.     Vascular: No carotid bruit.     Comments: No bruit noted bilaterally. Cardiovascular:     Rate and  Rhythm: Normal rate and regular rhythm.     Pulses: Normal pulses.          Radial pulses are 2+ on the right side and 2+ on the left side.       Dorsalis pedis pulses are 2+ on the right side and 2+ on the left side.     Heart sounds: Normal heart sounds. No murmur. No friction rub. No gallop.   Pulmonary:     Effort: Pulmonary effort is normal.     Breath sounds: Normal breath sounds.     Comments: Lungs clear to auscultation bilaterally.  Symmetric chest rise.  No wheezing, rales, rhonchi. Abdominal:     Palpations: Abdomen is soft. Abdomen is not rigid.     Tenderness: There  is no abdominal tenderness. There is no guarding.  Musculoskeletal: Normal range of motion.     Thoracic back: He exhibits no tenderness.     Lumbar back: He exhibits no tenderness.       Back:     Comments: Patient reports pain to the left paraspinal muscles of the upper thoracic and lower cervical region but I am unable to reproduce pain on palpation.  No midline T or L-spine tenderness.  Subjective pain noted to left shoulder.  Unable to reproduce pain with range of motion of shoulder.  No deformity or crepitus noted.  Skin:    General: Skin is warm and dry.     Capillary Refill: Capillary refill takes less than 2 seconds.     Comments: Good distal cap refill.  BUE and BLE are not dusky in appearance or cool to touch. No rash.   Neurological:     Mental Status: He is alert and oriented to person, place, and time.  Psychiatric:        Speech: Speech normal.      ED Treatments / Results  Labs (all labs ordered are listed, but only abnormal results are displayed) Labs Reviewed  BASIC METABOLIC PANEL - Abnormal; Notable for the following components:      Result Value   Sodium 133 (*)    Chloride 97 (*)    Glucose, Bld 387 (*)    All other components within normal limits  CBC - Abnormal; Notable for the following components:   WBC 13.3 (*)    RBC 5.93 (*)    All other components within normal limits    TROPONIN I (HIGH SENSITIVITY)  TROPONIN I (HIGH SENSITIVITY)    EKG EKG Interpretation  Date/Time:  Saturday August 16 2019 07:23:44 EDT Ventricular Rate:  91 PR Interval:    QRS Duration: 90 QT Interval:  341 QTC Calculation: 420 R Axis:   22 Text Interpretation:  Sinus rhythm Confirmed by Virgel Manifold [phone]) on 08/16/2019 9:43:56 AM   Radiology Dg Chest 2 View  Result Date: 08/16/2019 CLINICAL DATA:  Pt stated he went to the doctor yesterday with back pain under his left shoulder blade area after he moved heavy things on Wednesday. Pt stated today the pain is radiating to his left chest and left arm. EXAM: CHEST - 2 VIEW COMPARISON:  None. FINDINGS: Heart size is normal. Lungs are clear. There is moderate degenerative change in the midthoracic spine. IMPRESSION: No active cardiopulmonary disease. Electronically Signed   By: Nolon Nations M.D.   On: 08/16/2019 08:38   Ct Angio Chest/abd/pel For Dissection W And/or Wo Contrast  Result Date: 08/16/2019 CLINICAL DATA:  Chest/back pain, normal EKG, evaluate for dissection EXAM: CT ANGIOGRAPHY CHEST, ABDOMEN AND PELVIS TECHNIQUE: Multidetector CT imaging through the chest, abdomen and pelvis was performed using the standard protocol during bolus administration of intravenous contrast. Multiplanar reconstructed images and MIPs were obtained and reviewed to evaluate the vascular anatomy. CONTRAST:  188mL OMNIPAQUE IOHEXOL 350 MG/ML SOLN COMPARISON:  Chest radiographs dated 08/16/2019 FINDINGS: CTA CHEST FINDINGS Cardiovascular: On unenhanced CT, there is no evidence of intramural hematoma. No evidence of thoracic aortic aneurysm or dissection. Although not tailored for evaluation of the pulmonary arteries, there is no evidence of central pulmonary embolism. The heart is normal in size.  No pericardial effusion. Mediastinum/Nodes: No suspicious mediastinal lymphadenopathy. Visualized thyroid is unremarkable. Lungs/Pleura: 3 mm calcified  granuloma in the right upper lobe (series 6/image 87), benign. No suspicious pulmonary nodules.  No focal consolidation. No pleural effusion or pneumothorax. Musculoskeletal: Degenerative changes of the thoracic spine. No fracture is seen. Review of the MIP images confirms the above findings. CTA ABDOMEN AND PELVIS FINDINGS VASCULAR Aorta: No evidence abdominal aortic aneurysm or dissection.  Patent. Celiac: Patent. SMA: Patent. Renals: Patent bilaterally.  Two accessory left renal arteries. IMA: Patent. Inflow: Patent bilaterally. Veins: Grossly unremarkable. Review of the MIP images confirms the above findings. NON-VASCULAR Hepatobiliary: Hepatic steatosis with focal fatty sparing along the gallbladder fossa. Gallbladder is unremarkable. No intrahepatic or extrahepatic ductal dilatation. Pancreas: Within normal limits. Spleen: Within normal limits. Adrenals/Urinary Tract: Adrenal glands are within normal limits. Kidneys are within normal limits.  No hydronephrosis. Bladder is within normal limits. Stomach/Bowel: Stomach is within normal limits. No evidence of bowel obstruction. Normal appendix (series 5/image 149). Lymphatic: Small retroperitoneal lymph nodes measuring up to 9 mm in the left para-aortic region (series 5/image 130), within normal limits. 9 mm short axis left deep inguinal node, within normal limits. Reproductive: Prostate is unremarkable. Other: No abdominopelvic ascites. Musculoskeletal: Mild degenerative changes of the lumbar spine. No fracture is seen. Review of the MIP images confirms the above findings. IMPRESSION: No evidence of thoracoabdominal aortic aneurysm or dissection. No evidence of acute cardiopulmonary disease. Hepatic steatosis.  Otherwise unremarkable CT abdomen/pelvis. Electronically Signed   By: Charline BillsSriyesh  Krishnan M.D.   On: 08/16/2019 09:39    Procedures Procedures (including critical care time)  Medications Ordered in ED Medications  sodium chloride (PF) 0.9 % injection  (has no administration in time range)  sodium chloride flush (NS) 0.9 % injection 3 mL (3 mLs Intravenous Given 08/16/19 0742)  morphine 4 MG/ML injection 4 mg (4 mg Intravenous Given 08/16/19 0753)  ondansetron (ZOFRAN) injection 4 mg (4 mg Intravenous Given 08/16/19 0753)  iohexol (OMNIPAQUE) 350 MG/ML injection 100 mL (100 mLs Intravenous Contrast Given 08/16/19 0901)     Initial Impression / Assessment and Plan / ED Course  I have reviewed the triage vital signs and the nursing notes.  Pertinent labs & imaging results that were available during my care of the patient were reviewed by me and considered in my medical decision making (see chart for details).     HEAR Score: 2  48 y.o. M who presents for evaluation of left-sided chest pain that began this morning at 4 AM.  Reports he has had some back pain over the last 2 days that he thought was due to some heavy lifting.  Now worsening back pain radiates to chest, arm, neck.  No numbness/weakness of arms or legs.  Initially to arrival, he is afebrile, appears comfortably sitting on examination table.  He is hypertensive.  Vitals otherwise stable.  Concern for ACS etiology though atyical versus musculoskeletal injury versus infectious process. No rash that would indicate shingles. Doubt PE. Do not suspect unstable angina.   Patient is slightly hypertensive.  I am unable to reproduce his pain with palpation or movement of his shoulder.  Given that he radiates to the back and the arm as well as the neck, will plan for imaging for evaluation of dissection.  CBC with slight leukocytosis 13.3.  Hemoglobin stable. BMP shows BUN and Cr are within normal limits. Trop negative.   Patient's history, physical and risk factors, his heart score 2.  We will plan for delta troponin.  CTA shows no evidence of dissection.  There is mention of a small 3 mm calcified granuloma noted in the right upper lobe.  This is  most likely incidental finding.  No other acute  abnormalities.  Delta troponin negative.  At this time, given reassuring EKG, 2- delta troponins, do not feel that her symptoms are related to ACS etiology.  Discussed results with patient.  He is sitting up comfortably in no acute distress.  Reports improvement in pain after analgesics.  Vitals are stable.  Patient stable for discharge at this time.  Encouraged him to continue taking prednisone muscle relaxers as prescribed.  Will give short course of pain medication to help with pain.  I discussed with him regarding the granuloma seen on CTA.  Symptoms follow-up with primary care doctor and provided him: [facility] if he cannot get an appointment. At this time, patient exhibits no emergent life-threatening condition that require further evaluation in ED or admission. Patient had ample opportunity for questions and discussion. All patient's questions were answered with full understanding. Strict return precautions discussed. Patient expresses understanding and agreement to plan.   Portions of this note were generated with Scientist, clinical (histocompatibility and immunogenetics)Dragon dictation software. Dictation errors may occur despite best attempts at proofreading.   Final Clinical Impressions(s) / ED Diagnoses   Final diagnoses:  Atypical chest pain  Acute left-sided thoracic back pain  Lung granuloma [facility](HCC)    ED Discharge Orders         Ordered    HYDROcodone-acetaminophen (NORCO/VICODIN) 5-325 MG tablet  Every 8 hours PRN     08/16/19 1346           Rosana HoesLayden, Jasalyn Frysinger A, PA-C 08/16/19 1720    Raeford RazorKohut, Stephen, MD 08/18/19 1010

## 2019-08-16 NOTE — Discharge Instructions (Signed)
You can take Tylenol or Ibuprofen as directed for pain. You can alternate Tylenol and Ibuprofen every 4 hours. If you take Tylenol at 1pm, then you can take Ibuprofen at 5pm. Then you can take Tylenol again at 9pm.   Do not exceed 4000 mg of Tylenol a day or 1000 mg of ibuprofen a day.   Take pain medications as directed for break through pain. Do not drive or operate machinery while taking this medication.   Continue taking prednisone and Robaxin as given by urgent care.  Do not take Robaxin and pain medication at the same time.  As we discussed, your CT scan was reassuring.  There was mention of a benign granuloma in the lung.  Usually this just needs follow-up with primary care doctor.  I provided you a referral to [facility].  Return to the Emergency Department immediately if you experiencing worsening chest pain, difficulty breathing, nausea/vomiting, get very sweaty, headache or any other worsening or concerning symptoms.

## 2019-08-16 NOTE — ED Notes (Signed)
Patient given discharge teaching and verbalized understanding. Patient ambulated out of ED with a steady gait. 

## 2019-08-20 ENCOUNTER — Other Ambulatory Visit: Payer: Self-pay | Admitting: Sports Medicine

## 2019-08-20 DIAGNOSIS — M542 Cervicalgia: Secondary | ICD-10-CM

## 2019-08-23 ENCOUNTER — Other Ambulatory Visit: Payer: Managed Care, Other (non HMO)

## 2020-08-23 IMAGING — CT CT ANGIO CHEST-ABD-PELV FOR DISSECTION W/ AND WO/W CM
3 of 8 series · 12 of 46 positions shown, 18 images · IV contrast (omnipaque)
Comparison: Chest radiographs dated 08/16/2019

CLINICAL DATA: Chest/back pain, normal EKG, evaluate for dissection

EXAM:
CT ANGIOGRAPHY CHEST, ABDOMEN AND PELVIS
TECHNIQUE: Multidetector CT imaging through the chest, abdomen and pelvis was
performed using the standard protocol during bolus administration of
intravenous contrast. Multiplanar reconstructed images and MIPs were
obtained and reviewed to evaluate the vascular anatomy.
CONTRAST:  100mL OMNIPAQUE IOHEXOL 350 MG/ML SOLN

[Series 5: axial arterial · axial · arterial · 0.97mm/px · z∈[-628,-118]mm · 7 of 228 slices shown, 12 images]
[im 29/228  soft-tissue]
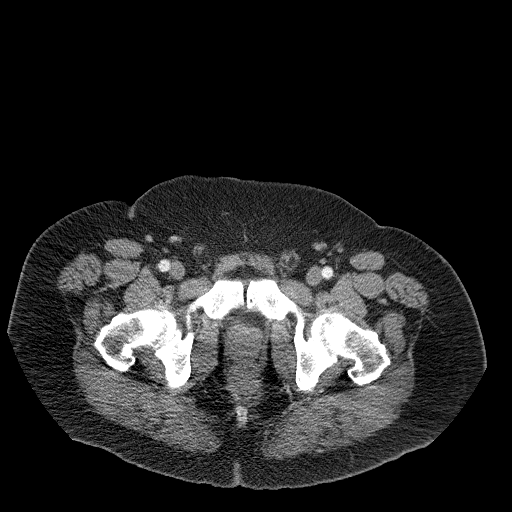
[im 29/228  bone]
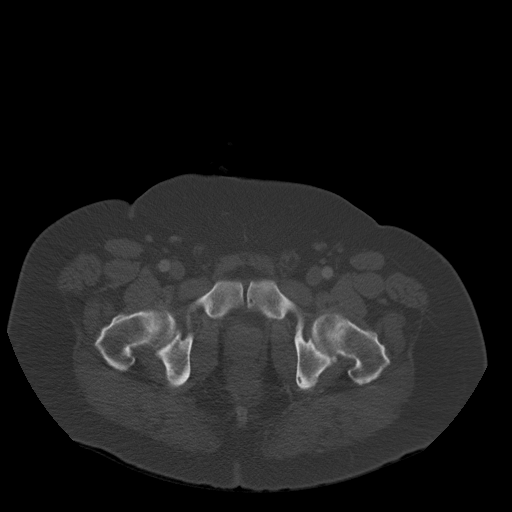
[im 57/228  soft-tissue]
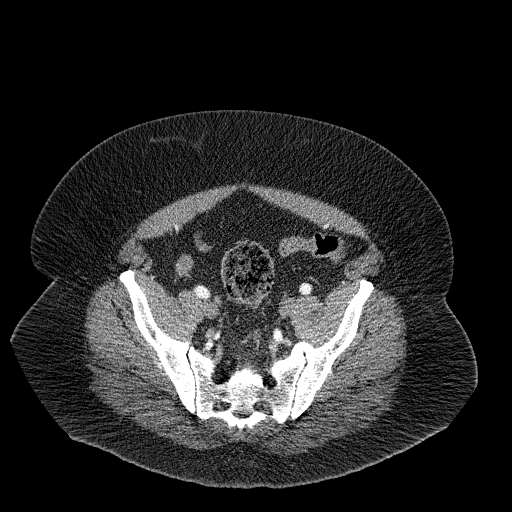
[im 86/228  soft-tissue]
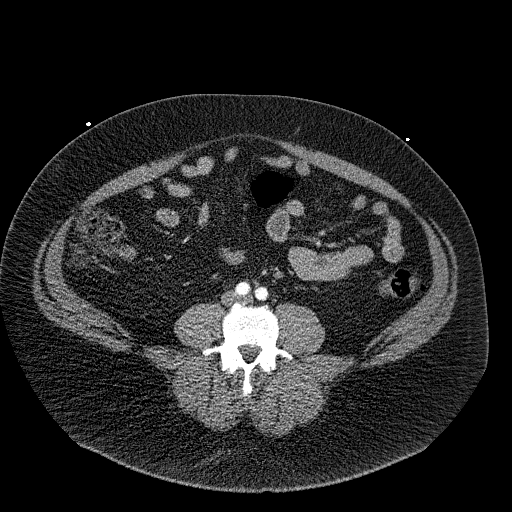
[im 114/228  soft-tissue]
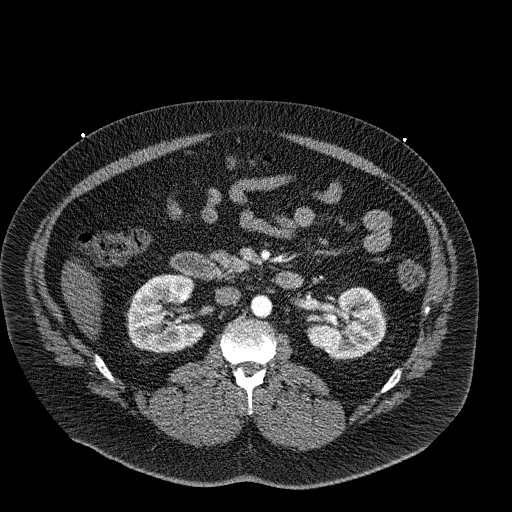
[im 114/228  lung]
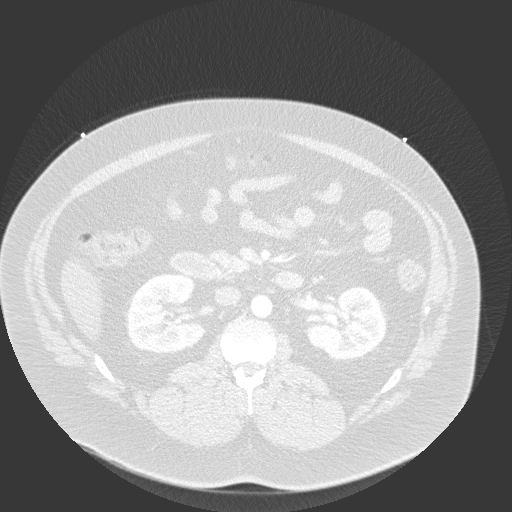
[im 142/228  soft-tissue]
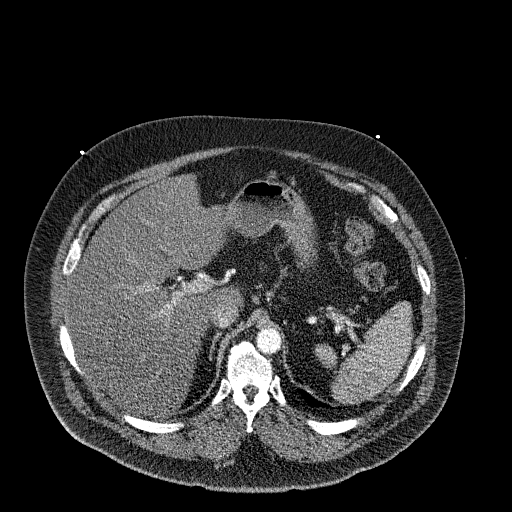
[im 142/228  lung]
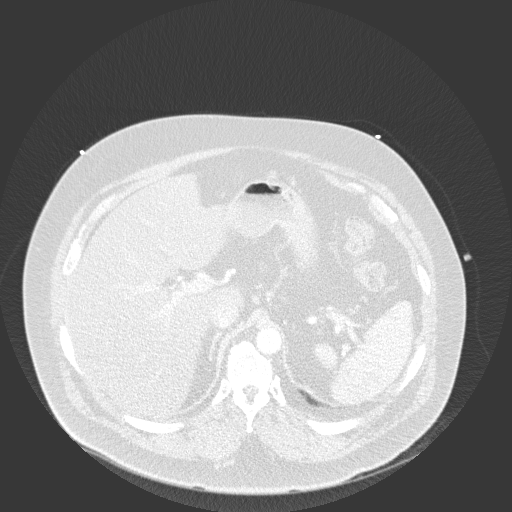
[im 171/228  soft-tissue]
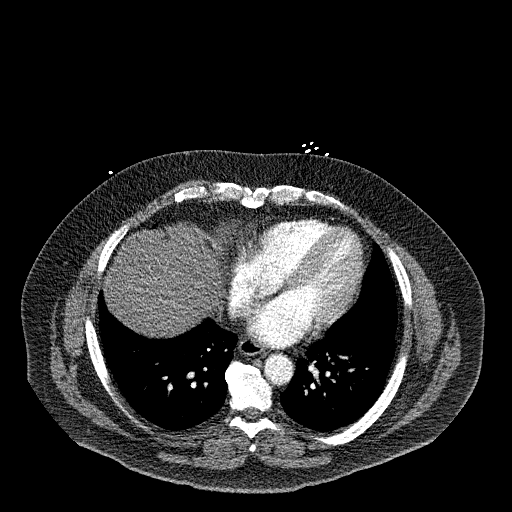
[im 171/228  lung]
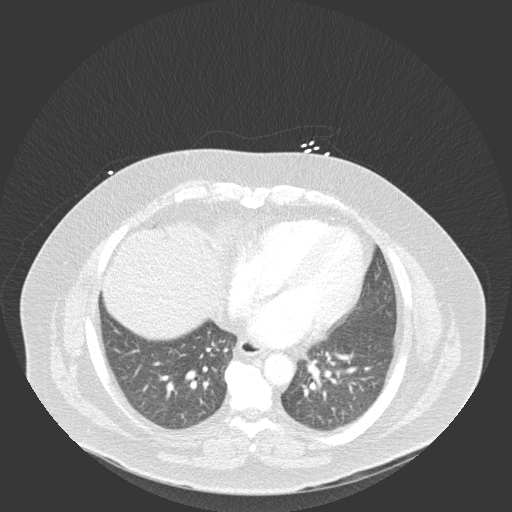
[im 199/228  soft-tissue]
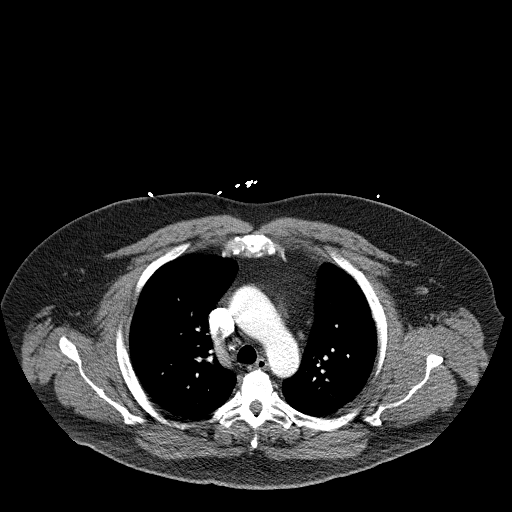
[im 199/228  lung]
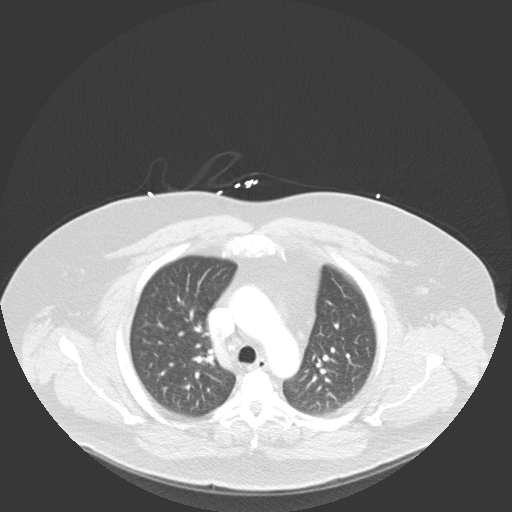

[Series 7: coronals · coronal · 1.22mm/px · 3 of 189 slices shown, 4 images]
[im 48/189  soft-tissue]
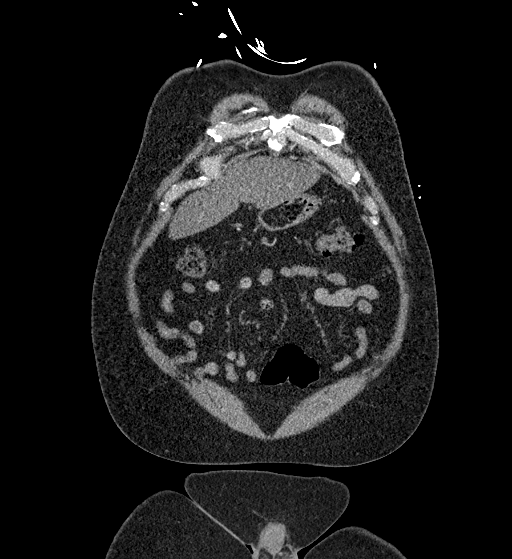
[im 95/189  soft-tissue]
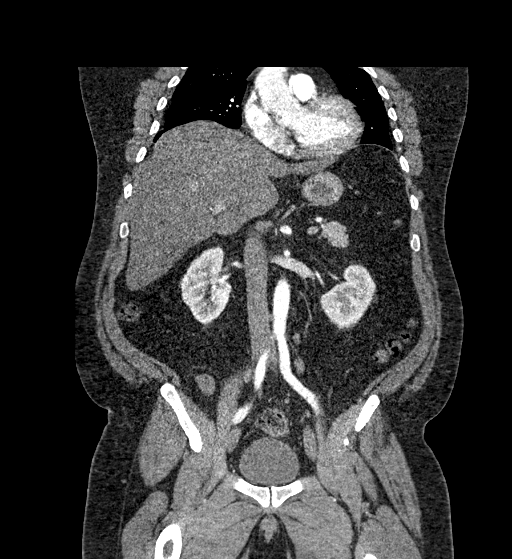
[im 95/189  bone]
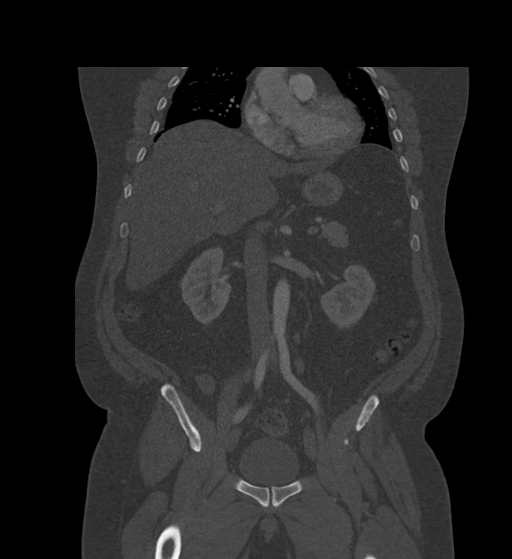
[im 142/189  soft-tissue]
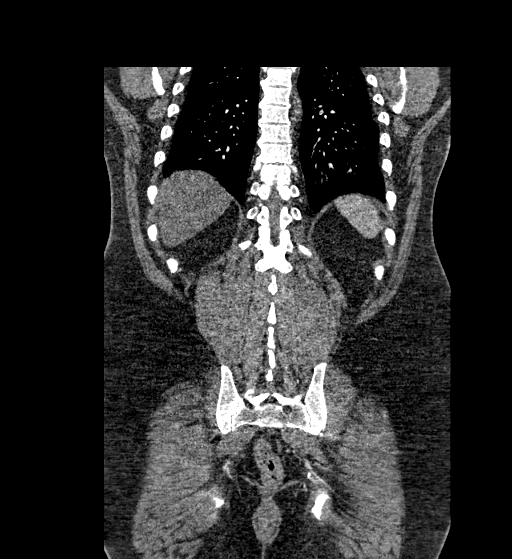

[Series 12: lung · axial · 0.97mm/px · z∈[-661,-557]mm · 2 of 342 slices shown]
[im 27/342  bone]
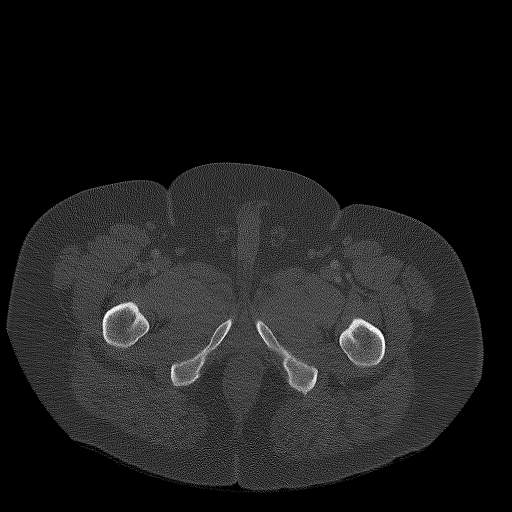
[im 79/342  bone]
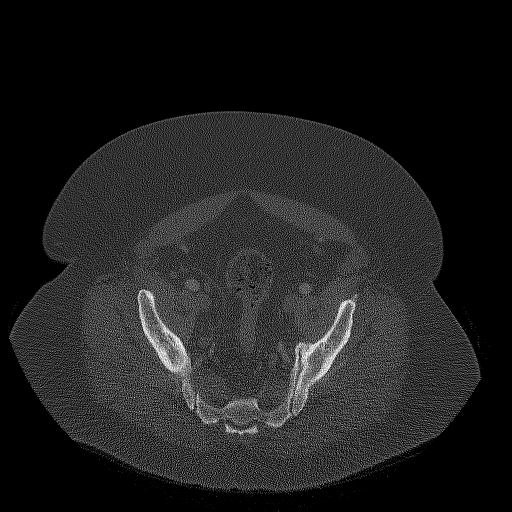

[12 of 46 positions shown; findings below may reference images not displayed]

FINDINGS: CTA CHEST FINDINGS

Cardiovascular: On unenhanced CT, there is no evidence of intramural
hematoma.

No evidence of thoracic aortic aneurysm or dissection.

Although not tailored for evaluation of the pulmonary arteries,
there is no evidence of central pulmonary embolism.

The heart is normal in size.  No pericardial effusion.

Mediastinum/Nodes: No suspicious mediastinal lymphadenopathy.

Visualized thyroid is unremarkable.

Lungs/Pleura: 3 mm calcified granuloma in the right upper lobe
(series 6/image 87), benign.

No suspicious pulmonary nodules.

No focal consolidation.

No pleural effusion or pneumothorax.

Musculoskeletal: Degenerative changes of the thoracic spine.

No fracture is seen.

Review of the MIP images confirms the above findings.

CTA ABDOMEN AND PELVIS FINDINGS

VASCULAR

Aorta: No evidence abdominal aortic aneurysm or dissection.  Patent.

Celiac: Patent.

SMA: Patent.

Renals: Patent bilaterally.  Two accessory left renal arteries.

IMA: Patent.

Inflow: Patent bilaterally.

Veins: Grossly unremarkable.

Review of the MIP images confirms the above findings.

NON-VASCULAR

Hepatobiliary: Hepatic steatosis with focal fatty sparing along the
gallbladder fossa.

Gallbladder is unremarkable. No intrahepatic or extrahepatic ductal
dilatation.

Pancreas: Within normal limits.

Spleen: Within normal limits.

Adrenals/Urinary Tract: Adrenal glands are within normal limits.

Kidneys are within normal limits.  No hydronephrosis.

Bladder is within normal limits.

Stomach/Bowel: Stomach is within normal limits.

No evidence of bowel obstruction.

Normal appendix (series 5/image 149).

Lymphatic: Small retroperitoneal lymph nodes measuring up to 9 mm in
the left para-aortic region (series 5/image 130), within normal
limits.

9 mm short axis left deep inguinal node, within normal limits.

Reproductive: Prostate is unremarkable.

Other: No abdominopelvic ascites.

Musculoskeletal: Mild degenerative changes of the lumbar spine. No
fracture is seen.

Review of the MIP images confirms the above findings.
IMPRESSION: No evidence of thoracoabdominal aortic aneurysm or dissection.

No evidence of acute cardiopulmonary disease.

Hepatic steatosis.  Otherwise unremarkable CT abdomen/pelvis.

## 2020-08-28 ENCOUNTER — Emergency Department (HOSPITAL_BASED_OUTPATIENT_CLINIC_OR_DEPARTMENT_OTHER): Payer: Managed Care, Other (non HMO)

## 2020-08-28 ENCOUNTER — Other Ambulatory Visit: Payer: Self-pay

## 2020-08-28 ENCOUNTER — Inpatient Hospital Stay (HOSPITAL_BASED_OUTPATIENT_CLINIC_OR_DEPARTMENT_OTHER)
Admission: EM | Admit: 2020-08-28 | Discharge: 2020-09-01 | DRG: 177 | Disposition: A | Discharge status: Home / Self Care | Payer: Managed Care, Other (non HMO) | Attending: Internal Medicine | Admitting: Internal Medicine

## 2020-08-28 ENCOUNTER — Encounter (HOSPITAL_BASED_OUTPATIENT_CLINIC_OR_DEPARTMENT_OTHER): Payer: Self-pay | Admitting: Emergency Medicine

## 2020-08-28 DIAGNOSIS — E089 Diabetes mellitus due to underlying condition without complications: Secondary | ICD-10-CM

## 2020-08-28 DIAGNOSIS — J1282 Pneumonia due to coronavirus disease 2019: Secondary | ICD-10-CM | POA: Diagnosis present

## 2020-08-28 DIAGNOSIS — E119 Type 2 diabetes mellitus without complications: Secondary | ICD-10-CM | POA: Diagnosis present

## 2020-08-28 DIAGNOSIS — Z6841 Body Mass Index (BMI) 40.0 and over, adult: Secondary | ICD-10-CM

## 2020-08-28 DIAGNOSIS — G4733 Obstructive sleep apnea (adult) (pediatric): Secondary | ICD-10-CM | POA: Diagnosis present

## 2020-08-28 DIAGNOSIS — Z9119 Patient's noncompliance with other medical treatment and regimen: Secondary | ICD-10-CM

## 2020-08-28 DIAGNOSIS — U071 COVID-19: Secondary | ICD-10-CM | POA: Diagnosis not present

## 2020-08-28 DIAGNOSIS — R0902 Hypoxemia: Secondary | ICD-10-CM

## 2020-08-28 DIAGNOSIS — J9601 Acute respiratory failure with hypoxia: Secondary | ICD-10-CM | POA: Diagnosis present

## 2020-08-28 LAB — COMPREHENSIVE METABOLIC PANEL
ALT: 35 U/L (ref 0–44)
AST: 43 U/L — ABNORMAL HIGH (ref 15–41)
Albumin: 3.4 g/dL — ABNORMAL LOW (ref 3.5–5.0)
Alkaline Phosphatase: 49 U/L (ref 38–126)
Anion gap: 13 (ref 5–15)
BUN: 13 mg/dL (ref 6–20)
CO2: 23 mmol/L (ref 22–32)
Calcium: 8.3 mg/dL — ABNORMAL LOW (ref 8.9–10.3)
Chloride: 93 mmol/L — ABNORMAL LOW (ref 98–111)
Creatinine, Ser: 0.75 mg/dL (ref 0.61–1.24)
GFR calc Af Amer: >60 mL/min (ref >60)
GFR calc non Af Amer: >60 mL/min (ref >60)
Glucose, Bld: 295 mg/dL — ABNORMAL HIGH (ref 70–99)
Potassium: 3.7 mmol/L (ref 3.5–5.1)
Sodium: 129 mmol/L — ABNORMAL LOW (ref 135–145)
Total Bilirubin: 0.9 mg/dL (ref 0.3–1.2)
Total Protein: 7.3 g/dL (ref 6.5–8.1)

## 2020-08-28 LAB — CBC WITH DIFFERENTIAL/PLATELET
Abs Immature Granulocytes: 0.04 10*3/uL (ref 0.00–0.07)
Basophils Absolute: 0 10*3/uL (ref 0.0–0.1)
Basophils Relative: 0 %
Eosinophils Absolute: 0 10*3/uL (ref 0.0–0.5)
Eosinophils Relative: 0 %
HCT: 46.4 % (ref 39.0–52.0)
Hemoglobin: 15.7 g/dL (ref 13.0–17.0)
Immature Granulocytes: 0 %
Lymphocytes Relative: 10 %
Lymphs Abs: 1 10*3/uL (ref 0.7–4.0)
MCH: 26.9 pg (ref 26.0–34.0)
MCHC: 33.8 g/dL (ref 30.0–36.0)
MCV: 79.5 fL — ABNORMAL LOW (ref 80.0–100.0)
Monocytes Absolute: 0.6 10*3/uL (ref 0.1–1.0)
Monocytes Relative: 6 %
Neutro Abs: 8.5 10*3/uL — ABNORMAL HIGH (ref 1.7–7.7)
Neutrophils Relative %: 84 %
Platelets: 199 10*3/uL (ref 150–400)
RBC: 5.84 MIL/uL — ABNORMAL HIGH (ref 4.22–5.81)
RDW: 12.7 % (ref 11.5–15.5)
WBC: 10.1 10*3/uL (ref 4.0–10.5)
nRBC: 0 % (ref 0.0–0.2)

## 2020-08-28 LAB — FIBRINOGEN: Fibrinogen: 570 mg/dL — ABNORMAL HIGH (ref 210–475)

## 2020-08-28 LAB — LACTIC ACID, PLASMA: Lactic Acid, Venous: 1.8 mmol/L (ref 0.5–1.9)

## 2020-08-28 LAB — PROCALCITONIN: Procalcitonin: <0.1 ng/mL

## 2020-08-28 LAB — TRIGLYCERIDES: Triglycerides: 137 mg/dL (ref <150)

## 2020-08-28 LAB — D-DIMER, QUANTITATIVE: D-Dimer, Quant: 0.46 ug/mL-FEU (ref 0.00–0.50)

## 2020-08-28 LAB — C-REACTIVE PROTEIN: CRP: 9.6 mg/dL — ABNORMAL HIGH (ref <1.0)

## 2020-08-28 LAB — LACTATE DEHYDROGENASE: LDH: 251 U/L — ABNORMAL HIGH (ref 98–192)

## 2020-08-28 LAB — FERRITIN: Ferritin: 582 ng/mL — ABNORMAL HIGH (ref 24–336)

## 2020-08-28 MED ORDER — SODIUM CHLORIDE 0.9 % IV SOLN
INTRAVENOUS | Status: DC | PRN
  Administered 2020-08-28: 250 mL via INTRAVENOUS

## 2020-08-28 MED ORDER — SODIUM CHLORIDE 0.9 % IV SOLN
100.0000 mg | INTRAVENOUS | Status: AC
  Administered 2020-08-28 (×2): 100 mg via INTRAVENOUS
  Filled 2020-08-28 (×2): qty 20

## 2020-08-28 MED ORDER — DEXAMETHASONE SODIUM PHOSPHATE 10 MG/ML IJ SOLN
6.0000 mg | INTRAMUSCULAR | Status: DC
  Administered 2020-08-28: 6 mg via INTRAVENOUS
  Filled 2020-08-28: qty 1

## 2020-08-28 MED ORDER — SODIUM CHLORIDE 0.9 % IV SOLN
100.0000 mg | Freq: Every day | INTRAVENOUS | Status: AC
  Administered 2020-08-29 – 2020-09-01 (×4): 100 mg via INTRAVENOUS
  Filled 2020-08-28 (×4): qty 20

## 2020-08-28 NOTE — ED Notes (Signed)
Spoke with Pharmacist Clydie Braun at [facility] regarding wrong Remdesivir being scanned. Was requested to change to documentation to validate correct dosage given. Updated meds records, will give report to next shift and floor.

## 2020-08-28 NOTE — ED Notes (Signed)
Pt has Covid+ result from [facility]-08/21/2020, edp notified

## 2020-08-28 NOTE — ED Provider Notes (Signed)
[facility] EMERGENCY DEPARTMENT Provider Note   CSN: [id number] Arrival date & time: 08/28/20  1222     History Chief Complaint  Patient presents with  . Covid Positive  . Shortness of Breath    Eric Rios is a 49 y.o. male.  HPI      49yo male with history of OSA, obesity, presents with concern for shortness of breath after testing positive for COVID 19 one week ago.    Has dyspnea for days, low grade fever, cough, nausea, mild diarrhea. No chest pain.   No appetite, can't taste or smell 1 week Shortness of breath ove rlast few days Reports given steroids, shot of something at dr office and sent home  Reports wanted to get vaccine but was turned away by [facility] because he didn't have an appointment    Past Medical History:  Diagnosis Date  . Chronic back pain   . Elevated blood pressure reading     Patient Active Problem List   Diagnosis Date Noted  . Pneumonia due to COVID-19 virus 08/28/2020  . Influenza 01/26/2017  . OSA (obstructive sleep apnea) 10/16/2016  . Hypersomnia 08/07/2016  . Obesity 08/07/2016  . Encounter for preventative adult health care exam with abnormal findings 05/15/2016  . Left-sided low back pain without sciatica 05/15/2016  . Other fatigue 05/15/2016  . Elevated blood pressure 05/15/2016  . Morbid obesity with BMI of 50.0-59.9, adult (HCC) 05/15/2016    History reviewed. No pertinent surgical history.     Family History  Problem Relation Age of Onset  . Hypertension Father   . Lung cancer Father     Social History   Tobacco Use  . Smoking status: Never Smoker  . Smokeless tobacco: Never Used  Substance Use Topics  . Alcohol use: No    Alcohol/week: 0.0 standard drinks  . Drug use: No    Home Medications Prior to Admission medications   Medication Sig Start Date End Date Taking? Authorizing Provider  HYDROcodone-acetaminophen (NORCO/VICODIN) 5-325 MG tablet Take 1-2 tablets by mouth every 8 (eight) hours as  needed. 08/16/19   Maxwell Caul, PA-C  ibuprofen (ADVIL) 200 MG tablet Take 400 mg by mouth every 6 (six) hours as needed for moderate pain.    [provider]  tiZANidine (ZANAFLEX) 4 MG tablet Take 1-4 mg by mouth 4 (four) times daily. 08/15/19   [provider]    Allergies    Patient has no known allergies.  Review of Systems   Review of Systems  Constitutional: Positive for appetite change, chills and fever.  HENT: Positive for congestion.   Respiratory: Positive for cough and shortness of breath.   Cardiovascular: Negative for chest pain.  Gastrointestinal: Positive for diarrhea. Negative for abdominal pain, nausea and vomiting.  Genitourinary: Negative for dysuria.  Neurological: Negative for headaches.    Physical Exam Updated Vital Signs BP (!) 170/82   Pulse 85   Temp 99.1 F (37.3 C) (Oral)   Resp (!) 37   SpO2 91%   Physical Exam Vitals and nursing note reviewed.  Constitutional:      General: He is not in acute distress.    Appearance: He is well-developed. He is not diaphoretic.  HENT:     Head: Normocephalic and atraumatic.  Eyes:     Conjunctiva/sclera: Conjunctivae normal.  Cardiovascular:     Rate and Rhythm: Normal rate and regular rhythm.     Heart sounds: Normal heart sounds. No murmur heard.  No  friction rub. No gallop.   Pulmonary:     Effort: Pulmonary effort is normal. No respiratory distress.     Breath sounds: Normal breath sounds. No wheezing or rales.  Abdominal:     General: There is no distension.     Palpations: Abdomen is soft.     Tenderness: There is no abdominal tenderness. There is no guarding.  Musculoskeletal:     Cervical back: Normal range of motion.  Skin:    General: Skin is warm and dry.  Neurological:     Mental Status: He is alert and oriented to person, place, and time.     ED Results / Procedures / Treatments   Labs (all labs ordered are listed, but only abnormal results are  displayed) Labs Reviewed  CBC WITH DIFFERENTIAL/PLATELET - Abnormal; Notable for the following components:      Result Value   RBC 5.84 (*)    MCV 79.5 (*)    Neutro Abs 8.5 (*)    All other components within normal limits  COMPREHENSIVE METABOLIC PANEL - Abnormal; Notable for the following components:   Sodium 129 (*)    Chloride 93 (*)    Glucose, Bld 295 (*)    Calcium 8.3 (*)    Albumin 3.4 (*)    AST 43 (*)    All other components within normal limits  LACTATE DEHYDROGENASE - Abnormal; Notable for the following components:   LDH 251 (*)    All other components within normal limits  FERRITIN - Abnormal; Notable for the following components:   Ferritin 582 (*)    All other components within normal limits  FIBRINOGEN - Abnormal; Notable for the following components:   Fibrinogen 570 (*)    All other components within normal limits  C-REACTIVE PROTEIN - Abnormal; Notable for the following components:   CRP 9.6 (*)    All other components within normal limits  CULTURE, BLOOD (ROUTINE X 2)  CULTURE, BLOOD (ROUTINE X 2)  LACTIC ACID, PLASMA  D-DIMER, QUANTITATIVE (NOT AT [facility])  PROCALCITONIN  TRIGLYCERIDES  LACTIC ACID, PLASMA    EKG EKG Interpretation  Date/Time:  Saturday August 28 2020 12:49:50 EDT Ventricular Rate:  96 PR Interval:  142 QRS Duration: 88 QT Interval:  334 QTC Calculation: 421 R Axis:   31 Text Interpretation: Normal sinus rhythm Normal ECG No significant change since last tracing Confirmed by Alvira Monday ([id number]) on 08/28/2020 2:27:36 PM   Radiology DG Chest 2 View  Result Date: 08/28/2020 CLINICAL DATA:  Short of breath. Diagnosed with COVID 19 infection 1 week ago. EXAM: CHEST - 2 VIEW COMPARISON:  08/16/2019 FINDINGS: There hazy bilateral airspace lung opacities consistent with multifocal pneumonia. No pulmonary edema. No convincing pleural effusion or pneumothorax. Heart, mediastinum and hila are unremarkable. Skeletal structures are  grossly intact. IMPRESSION: 1. Hazy bilateral airspace lung opacities consistent with multifocal pneumonia, pattern compatible with COVID-19 infection. Electronically Signed   By: Amie Portland M.D.   On: 08/28/2020 13:23    Procedures Procedures (including critical care time)  Medications Ordered in ED Medications  dexamethasone (DECADRON) injection 6 mg (6 mg Intravenous Given 08/28/20 1447)  remdesivir 100 mg in sodium chloride 0.9 % 100 mL IVPB (100 mg Intravenous Not Given 08/28/20 1615)  0.9 %  sodium chloride infusion (250 mLs Intravenous New Bag/Given 08/28/20 1509)  remdesivir 100 mg in sodium chloride 0.9 % 100 mL IVPB (0 mg Intravenous Stopped 08/28/20 1616)    ED Course  I have reviewed  the triage vital signs and the nursing notes.  Pertinent labs & imaging results that were available during my care of the patient were reviewed by me and considered in my medical decision making (see chart for details).    MDM Rules/Calculators/A&P                          49yo male with history of OSA, obesity, presents with concern for shortness of breath after testing positive for COVID 19 one week ago.  Hypoxia down to 87% on room air improved on 2L [place]. Labs and XR consistent with COVID 19. Patient discharged in stable condition with understanding of reasons to return.    Final Clinical Impression(s) / ED Diagnoses Final diagnoses:  Hypoxia  COVID-19    Rx / DC Orders ED Discharge Orders    None       Alvira Monday, MD 08/28/20 2211

## 2020-08-28 NOTE — ED Notes (Addendum)
PTs O2 on 2LTs was 87%, Increased O2 to 6 LT with a SAT of 92 now. Will monitor.

## 2020-08-28 NOTE — Progress Notes (Signed)
Eric Rios is a 49 year old male with unremarkable past medical history who presented to med [facility] with progressive shortness of breath.  Diagnosed with Covid-19 on 08/21/2020 at [facility].  He was noted to be hypoxic on 87% on room air.  Currently requiring 2 L nasal cannula.  Patient was started on treatment with remdesivir and Decadron.  Excepted to medical/surgical bed.  Please call manager of [facility] at [phone] when pt arrives to floor

## 2020-08-28 NOTE — ED Notes (Signed)
Pt sleeping, no s/s of distress. O2 sats immediately increase when pt awakened.

## 2020-08-28 NOTE — ED Notes (Signed)
Bedside commode provided per patient request

## 2020-08-28 NOTE — ED Triage Notes (Signed)
Pt here with SOB from [facility] who said that O2 sats were low.

## 2020-08-29 DIAGNOSIS — R609 Edema, unspecified: Secondary | ICD-10-CM | POA: Diagnosis not present

## 2020-08-29 DIAGNOSIS — M7989 Other specified soft tissue disorders: Secondary | ICD-10-CM | POA: Diagnosis not present

## 2020-08-29 DIAGNOSIS — Z9119 Patient's noncompliance with other medical treatment and regimen: Secondary | ICD-10-CM | POA: Diagnosis not present

## 2020-08-29 DIAGNOSIS — E119 Type 2 diabetes mellitus without complications: Secondary | ICD-10-CM | POA: Diagnosis present

## 2020-08-29 DIAGNOSIS — J1282 Pneumonia due to coronavirus disease 2019: Secondary | ICD-10-CM | POA: Diagnosis present

## 2020-08-29 DIAGNOSIS — R0902 Hypoxemia: Secondary | ICD-10-CM

## 2020-08-29 DIAGNOSIS — I10 Essential (primary) hypertension: Secondary | ICD-10-CM | POA: Diagnosis not present

## 2020-08-29 DIAGNOSIS — E089 Diabetes mellitus due to underlying condition without complications: Secondary | ICD-10-CM | POA: Diagnosis not present

## 2020-08-29 DIAGNOSIS — Z6841 Body Mass Index (BMI) 40.0 and over, adult: Secondary | ICD-10-CM | POA: Diagnosis not present

## 2020-08-29 DIAGNOSIS — J9601 Acute respiratory failure with hypoxia: Secondary | ICD-10-CM

## 2020-08-29 DIAGNOSIS — G4733 Obstructive sleep apnea (adult) (pediatric): Secondary | ICD-10-CM | POA: Diagnosis present

## 2020-08-29 DIAGNOSIS — U071 COVID-19: Secondary | ICD-10-CM | POA: Diagnosis present

## 2020-08-29 LAB — HIV ANTIBODY (ROUTINE TESTING W REFLEX): HIV Screen 4th Generation wRfx: NONREACTIVE

## 2020-08-29 LAB — CBC WITH DIFFERENTIAL/PLATELET
Abs Immature Granulocytes: 0.04 10*3/uL (ref 0.00–0.07)
Basophils Absolute: 0 10*3/uL (ref 0.0–0.1)
Basophils Relative: 0 %
Eosinophils Absolute: 0 10*3/uL (ref 0.0–0.5)
Eosinophils Relative: 0 %
HCT: 44.6 % (ref 39.0–52.0)
Hemoglobin: 14.5 g/dL (ref 13.0–17.0)
Immature Granulocytes: 0 %
Lymphocytes Relative: 14 %
Lymphs Abs: 1.3 10*3/uL (ref 0.7–4.0)
MCH: 26.2 pg (ref 26.0–34.0)
MCHC: 32.5 g/dL (ref 30.0–36.0)
MCV: 80.5 fL (ref 80.0–100.0)
Monocytes Absolute: 0.5 10*3/uL (ref 0.1–1.0)
Monocytes Relative: 5 %
Neutro Abs: 7.7 10*3/uL (ref 1.7–7.7)
Neutrophils Relative %: 81 %
Platelets: 174 10*3/uL (ref 150–400)
RBC: 5.54 MIL/uL (ref 4.22–5.81)
RDW: 12.7 % (ref 11.5–15.5)
WBC: 9.5 10*3/uL (ref 4.0–10.5)
nRBC: 0 % (ref 0.0–0.2)

## 2020-08-29 LAB — COMPREHENSIVE METABOLIC PANEL
ALT: 32 U/L (ref 0–44)
AST: 36 U/L (ref 15–41)
Albumin: 2.9 g/dL — ABNORMAL LOW (ref 3.5–5.0)
Alkaline Phosphatase: 47 U/L (ref 38–126)
Anion gap: 10 (ref 5–15)
BUN: 11 mg/dL (ref 6–20)
CO2: 27 mmol/L (ref 22–32)
Calcium: 8.7 mg/dL — ABNORMAL LOW (ref 8.9–10.3)
Chloride: 98 mmol/L (ref 98–111)
Creatinine, Ser: 0.75 mg/dL (ref 0.61–1.24)
GFR calc Af Amer: >60 mL/min (ref >60)
GFR calc non Af Amer: >60 mL/min (ref >60)
Glucose, Bld: 274 mg/dL — ABNORMAL HIGH (ref 70–99)
Potassium: 3.7 mmol/L (ref 3.5–5.1)
Sodium: 135 mmol/L (ref 135–145)
Total Bilirubin: 0.8 mg/dL (ref 0.3–1.2)
Total Protein: 7.1 g/dL (ref 6.5–8.1)

## 2020-08-29 LAB — C-REACTIVE PROTEIN: CRP: 17.2 mg/dL — ABNORMAL HIGH (ref <1.0)

## 2020-08-29 LAB — GLUCOSE, CAPILLARY
Glucose-Capillary: 287 mg/dL — ABNORMAL HIGH (ref 70–99)
Glucose-Capillary: 245 mg/dL — ABNORMAL HIGH (ref 70–99)
Glucose-Capillary: 280 mg/dL — ABNORMAL HIGH (ref 70–99)
Glucose-Capillary: 346 mg/dL — ABNORMAL HIGH (ref 70–99)

## 2020-08-29 LAB — HEMOGLOBIN A1C
Hgb A1c MFr Bld: 10.8 % — ABNORMAL HIGH (ref 4.8–5.6)
Mean Plasma Glucose: 263.26 mg/dL

## 2020-08-29 LAB — D-DIMER, QUANTITATIVE: D-Dimer, Quant: 0.59 ug/mL-FEU — ABNORMAL HIGH (ref 0.00–0.50)

## 2020-08-29 MED ORDER — HYDROCOD POLST-CPM POLST ER 10-8 MG/5ML PO SUER: 5.0000 mL | Freq: Two times a day (BID) | ORAL | Status: DC | PRN

## 2020-08-29 MED ORDER — INSULIN ASPART 100 UNIT/ML ~~LOC~~ SOLN
4.0000 [IU] | Freq: Three times a day (TID) | SUBCUTANEOUS | Status: DC
  Administered 2020-08-29 (×2): 4 [IU] via SUBCUTANEOUS

## 2020-08-29 MED ORDER — METHYLPREDNISOLONE SODIUM SUCC 125 MG IJ SOLR
60.0000 mg | Freq: Two times a day (BID) | INTRAMUSCULAR | Status: DC
  Administered 2020-08-29 – 2020-08-31 (×5): 60 mg via INTRAVENOUS
  Filled 2020-08-29 (×5): qty 2

## 2020-08-29 MED ORDER — INSULIN ASPART 100 UNIT/ML ~~LOC~~ SOLN
0.0000 [IU] | Freq: Three times a day (TID) | SUBCUTANEOUS | Status: DC
  Administered 2020-08-29: 11 [IU] via SUBCUTANEOUS
  Administered 2020-08-29: 7 [IU] via SUBCUTANEOUS
  Administered 2020-08-30: 15 [IU] via SUBCUTANEOUS
  Administered 2020-08-30: 20 [IU] via SUBCUTANEOUS
  Administered 2020-08-30 – 2020-08-31 (×2): 15 [IU] via SUBCUTANEOUS
  Administered 2020-08-31: 20 [IU] via SUBCUTANEOUS
  Administered 2020-08-31: 15 [IU] via SUBCUTANEOUS
  Administered 2020-09-01: 7 [IU] via SUBCUTANEOUS
  Administered 2020-09-01: 20 [IU] via SUBCUTANEOUS
  Administered 2020-09-01: 11 [IU] via SUBCUTANEOUS

## 2020-08-29 MED ORDER — GUAIFENESIN-DM 100-10 MG/5ML PO SYRP
10.0000 mL | ORAL_SOLUTION | ORAL | Status: DC | PRN
  Administered 2020-08-29 – 2020-09-01 (×3): 10 mL via ORAL
  Filled 2020-08-29 (×3): qty 10

## 2020-08-29 MED ORDER — ACETAMINOPHEN 325 MG PO TABS: 650.0000 mg | ORAL_TABLET | Freq: Four times a day (QID) | ORAL | Status: DC | PRN

## 2020-08-29 MED ORDER — ONDANSETRON HCL 4 MG PO TABS: 4.0000 mg | ORAL_TABLET | Freq: Four times a day (QID) | ORAL | Status: DC | PRN

## 2020-08-29 MED ORDER — SODIUM CHLORIDE 0.9 % IV SOLN: 12.0000 g | INTRAVENOUS | Status: DC

## 2020-08-29 MED ORDER — INSULIN ASPART 100 UNIT/ML ~~LOC~~ SOLN
0.0000 [IU] | Freq: Every day | SUBCUTANEOUS | Status: DC
  Administered 2020-08-29: 4 [IU] via SUBCUTANEOUS
  Administered 2020-08-30 – 2020-08-31 (×2): 5 [IU] via SUBCUTANEOUS

## 2020-08-29 MED ORDER — INSULIN ASPART 100 UNIT/ML ~~LOC~~ SOLN
0.0000 [IU] | Freq: Three times a day (TID) | SUBCUTANEOUS | Status: DC
  Administered 2020-08-29: 8 [IU] via SUBCUTANEOUS

## 2020-08-29 MED ORDER — ONDANSETRON HCL 4 MG/2ML IJ SOLN: 4.0000 mg | Freq: Four times a day (QID) | INTRAMUSCULAR | Status: DC | PRN

## 2020-08-29 MED ORDER — INSULIN DETEMIR 100 UNIT/ML ~~LOC~~ SOLN
10.0000 [IU] | Freq: Two times a day (BID) | SUBCUTANEOUS | Status: DC
  Administered 2020-08-29 (×2): 10 [IU] via SUBCUTANEOUS
  Filled 2020-08-29 (×4): qty 0.1

## 2020-08-29 MED ORDER — ENOXAPARIN SODIUM 40 MG/0.4ML ~~LOC~~ SOLN
40.0000 mg | SUBCUTANEOUS | Status: DC
  Administered 2020-08-29 – 2020-08-30 (×2): 40 mg via SUBCUTANEOUS
  Filled 2020-08-29 (×2): qty 0.4

## 2020-08-29 NOTE — ED Notes (Signed)
Pt placed on a 12LT HFNC due to low SATS. SATS now 93%

## 2020-08-29 NOTE — ED Notes (Signed)
[facility] notified (Jaime) - patient ready for transport 

## 2020-08-29 NOTE — Progress Notes (Signed)
PROGRESS NOTE                                                                                                                                                                                                             Patient Demographics:    Eric Rios, is a 49 y.o. male, DOB - Jan 15, 1971, PXT:[id number]  Outpatient Primary MD for the patient is Eric Nest, NP   Admit date - 08/28/2020   LOS - 0  Chief Complaint  Patient presents with  . Covid Positive  . Shortness of Breath       Brief Narrative: Patient is a 49 y.o. male with PMHx morbid obesity, OSA (on CPAP at home)-diagnosed with COVID-19 on 8/28 presented to the hospital with worsening shortness of breath-found to have acute hypoxic respiratory failure secondary to COVID-19 pneumonia.  COVID-19 vaccinated status: Unvaccinated  Significant Events: 9/4>> Admit to [facility] for hypoxia due to COVID-19 pneumonia  Significant studies: 9/4>>Chest x-ray: Hazy bilateral airspace lung opacities consistent with multifocal pneumonia  COVID-19 medications: Steroids: 9/4>> Remdesivir: 9/4>>  Antibiotics: None  Microbiology data: 9/4 >>blood culture: No growth  Procedures: None  Consults: None  DVT prophylaxis: enoxaparin (LOVENOX) injection 40 mg Start: 08/29/20 0515   Subjective:    Eric Rios today on 2 L of oxygen this morning-suspected OSA-as required 4-5 L of oxygen at night.   Assessment  & Plan :   Acute Hypoxic Resp Failure due to Covid 19 Viral pneumonia: Appears to have mild hypoxemia-stable on 2 L of oxygen this morning.  Continue steroids-but change to Solu-Medrol-remains on remdesivir.  Do not think he needs either baricitinib or Actemra.  However if his hypoxia worsens-we can reconsider use of Actemra baricitinib.  Note-rationale/risk/benefits of both Actemra/baricitinib (patient aware use under EUA by FDA) discussed with patient-he  has no history of TB, hepatitis B.  He has no history of smoldering diverticulitis.  He is aware of the risk of VTE with baricitinib.  He consents to the use of these agents if his hypoxia worsens.   Fever: afebrile O2 requirements:  SpO2: 91 % O2 Flow Rate (L/min): 4 L/min   COVID-19 Labs: Recent Labs    08/28/20 1355 08/29/20 0938  DDIMER 0.46 0.59*  FERRITIN 582*  --   LDH 251*  --   CRP 9.6*  --  No results found for: BNP  Recent Labs  Lab 08/28/20 1355  PROCALCITON <0.10    No results found for: SARSCOV2NAA   Prone/Incentive Spirometry: encouraged  incentive spirometry use 3-4/hour.  DM-2 (A1c 10.8) with steroid-induced hyperglycemia: Change SSI to resistant scale-add 10 units of Levemir twice daily-follow and adjust.  Recent Labs    08/29/20 0818  GLUCAP 287*   HTN: BP on the higher side-we will observe another 24 hours before considering starting antihypertensives.  OSA: On CPAP at home which he uses irregularly-suspect he will require more oxygen at night or initiation of CPAP here in the hospital at night.  Morbid Obesity: Estimated body mass index is 49.18 kg/m as calculated from the following:   Height as of 10/16/16: 5\' 7"  (1.702 m).   Weight as of 01/26/17: 142.4 kg.    ABG: No results found for: PHART, PCO2ART, PO2ART, HCO3, TCO2, ACIDBASEDEF, O2SAT  Vent Settings: N/A   Condition -Stable  Family Communication  :  Spouse (Brittney [phone]) updated over the phone 9/5  Code Status :  Full Code  Diet :  Diet Order            Diet Carb Modified Fluid consistency: Thin; Room service appropriate? Yes  Diet effective now                  Disposition Plan  :   Status is: Inpatient  Remains inpatient appropriate because:Inpatient level of care appropriate due to severity of illness   Dispo: The patient is from: Home              Anticipated d/c is to: Home              Anticipated d/c date is: 2 day              Patient  currently is not medically stable to d/c.   Barriers to discharge: Hypoxia requiring O2 supplementation/complete 5 days of IV Remdesivir  Antimicorbials  :    Anti-infectives (From admission, onward)   Start     Dose/Rate Route Frequency Ordered Stop   08/29/20 1000  remdesivir 100 mg in sodium chloride 0.9 % 100 mL IVPB        100 mg 200 mL/hr over 30 Minutes Intravenous Daily 08/28/20 1452 09/02/20 0959   08/29/20 0515  nafcillin 12 g in sodium chloride 0.9 % 500 mL continuous infusion  Status:  Discontinued        12 g 20.8 mL/hr over 24 Hours Intravenous Every 24 hours 08/29/20 0503 08/29/20 0505   08/28/20 1500  remdesivir 100 mg in sodium chloride 0.9 % 100 mL IVPB        100 mg 200 mL/hr over 30 Minutes Intravenous Every 30 min 08/28/20 1452 08/28/20 1616      Inpatient Medications  Scheduled Meds: . dexamethasone (DECADRON) injection  6 mg Intravenous Q24H  . enoxaparin (LOVENOX) injection  40 mg Subcutaneous Q24H  . insulin aspart  0-15 Units Subcutaneous TID WC  . insulin aspart  0-5 Units Subcutaneous QHS   Continuous Infusions: . sodium chloride Stopped (08/29/20 0000)  . remdesivir 100 mg in NS 100 mL 100 mg (08/29/20 0855)   PRN Meds:.sodium chloride, acetaminophen, chlorpheniramine-HYDROcodone, guaiFENesin-dextromethorphan, ondansetron **OR** ondansetron (ZOFRAN) IV   Time Spent in minutes  25  See all Orders from today for further details   10/29/20 M.D on 08/29/2020 at 10:38 AM  To page go to [email or web address] - use universal password  [facility] -  Office  [phone]    Objective:   Vitals:   08/29/20 0400 08/29/20 0530 08/29/20 0700 08/29/20 0753  BP: (!) 155/94 (!) 159/79  (!) 139/57  Pulse: 85 93  85  Resp: (!) 30 (!) 22  20  Temp:    98.3 F (36.8 C)  TempSrc:    Oral  SpO2: 92%  96% 91%    Wt Readings from Last 3 Encounters:  01/26/17 (!) 142.4 kg  10/16/16 (!) 147.1 kg  05/15/16 (!) 146 kg     Intake/Output Summary  (Last 24 hours) at 08/29/2020 1038 Last data filed at 08/29/2020 0823 Gross per 24 hour  Intake 340 ml  Output 1275 ml  Net -935 ml     Physical Exam Gen Exam:Alert awake-not in any distress HEENT:atraumatic, normocephalic Chest: B/L clear to auscultation anteriorly CVS:S1S2 regular Abdomen:soft non tender, non distended Extremities:no edema Neurology: Non focal Skin: no rash   Data Review:    CBC Recent Labs  Lab 08/28/20 1355 08/29/20 0938  WBC 10.1 9.5  HGB 15.7 14.5  HCT 46.4 44.6  PLT 199 174  MCV 79.5* 80.5  MCH 26.9 26.2  MCHC 33.8 32.5  RDW 12.7 12.7  LYMPHSABS 1.0 1.3  MONOABS 0.6 0.5  EOSABS 0.0 0.0  BASOSABS 0.0 0.0    Chemistries  Recent Labs  Lab 08/28/20 1355 08/29/20 0938  NA 129* 135  K 3.7 3.7  CL 93* 98  CO2 23 27  GLUCOSE 295* 274*  BUN 13 11  CREATININE 0.75 0.75  CALCIUM 8.3* 8.7*  AST 43* 36  ALT 35 32  ALKPHOS 49 47  BILITOT 0.9 0.8   ------------------------------------------------------------------------------------------------------------------ Recent Labs    08/28/20 1355  TRIG 137    Lab Results  Component Value Date   HGBA1C 10.8 (H) 08/29/2020   ------------------------------------------------------------------------------------------------------------------ No results for input(s): TSH, T4TOTAL, T3FREE, THYROIDAB in the last 72 hours.  Invalid input(s): FREET3 ------------------------------------------------------------------------------------------------------------------ Recent Labs    08/28/20 1355  FERRITIN 582*    Coagulation profile No results for input(s): INR, PROTIME in the last 168 hours.  Recent Labs    08/28/20 1355 08/29/20 0938  DDIMER 0.46 0.59*    Cardiac Enzymes No results for input(s): CKMB, TROPONINI, MYOGLOBIN in the last 168 hours.  Invalid input(s): CK ------------------------------------------------------------------------------------------------------------------ No  results found for: BNP  Micro Results Recent Results (from the past 240 hour(s))  Blood Culture (routine x 2)     Status: None (Preliminary result)   Collection Time: 08/28/20  1:50 PM   Specimen: Right Antecubital; Blood  Result Value Ref Range Status   Specimen Description   Final    RIGHT ANTECUBITAL Performed at [facility], [address] [place], [place] [address]    Special Requests   Final    BOTTLES DRAWN AEROBIC AND ANAEROBIC Blood Culture results may not be optimal due to an inadequate volume of blood received in culture bottles Performed at [facility], [address] [place], [place] [address]    Culture   Final    NO GROWTH < 12 HOURS Performed at [facility], [address] [place], [place] [address]    Report Status PENDING  Incomplete  Blood Culture (routine x 2)     Status: None (Preliminary result)   Collection Time: 08/28/20  2:20 PM   Specimen: BLOOD RIGHT HAND  Result Value Ref Range Status   Specimen Description   Final  BLOOD RIGHT HAND Performed at [facility], [address] [place], [place] [address]    Special Requests   Final    BOTTLES DRAWN AEROBIC AND ANAEROBIC Blood Culture results may not be optimal due to an inadequate volume of blood received in culture bottles Performed at [facility], [address] [place], [place] [address]    Culture   Final    NO GROWTH < 12 HOURS Performed at [facility], [address], [place], [place] [address]    Report Status PENDING  Incomplete    Radiology Reports DG Chest 2 View  Result Date: 08/28/2020 CLINICAL DATA:  Short of breath. Diagnosed with COVID 19 infection 1 week ago. EXAM: CHEST - 2 VIEW COMPARISON:  08/16/2019 FINDINGS: There hazy bilateral airspace lung opacities consistent with multifocal pneumonia. No pulmonary edema. No convincing pleural effusion or pneumothorax. Heart, mediastinum and hila are unremarkable. Skeletal  structures are grossly intact. IMPRESSION: 1. Hazy bilateral airspace lung opacities consistent with multifocal pneumonia, pattern compatible with COVID-19 infection. Electronically Signed   By: Amie Portland M.D.   On: 08/28/2020 13:23

## 2020-08-29 NOTE — Progress Notes (Signed)
Patient arrived to room 5w12.  Assessment complete, VS obtained, and Admission database began.  Patient requiring 12L during transport, and able to lay on left right side and decrease oxygen to 4L HFNC

## 2020-08-29 NOTE — ED Notes (Signed)
Report attempted x 1. No answer on unit.

## 2020-08-29 NOTE — H&P (Signed)
History and Physical    Eric Rios GUY:[id number] DOB: [birth year] DOA: 08/28/2020  PCP: Doreene Nest, NP  Patient coming from: Home  I have personally briefly reviewed patient's old medical records in [facility]  Chief Complaint: COVID, SOB  HPI: Eric Rios is a 49 y.o. male with medical history significant of obesity.  Pt presents to [facility] with progressive SOB.  Pt diagnosed with COVID-19 on 8/28.  SOB progressively worsening.  Went to [facility] yesterday who referred him to the ED after he was found to be hypoxic.   ED Course: Pt hypoxic to 87% on RA, improved initially with 2L via [place].  Started on remdesivir and decadron.  O2 requirement has since increased to 4L via .  Currently satting mid 90s on 4L though.  Pt has been transferred to [facility] for admission.  CRP 9.6, procalcitonin neg, WBC 10.1k  CXR shows the expected B infiltrates of COVID-19.  D.Dimer 0.46   Review of Systems: As per HPI, otherwise all review of systems negative.  Past Medical History:  Diagnosis Date  . Chronic back pain   . Elevated blood pressure reading     History reviewed. No pertinent surgical history.   reports that he has never smoked. He has never used smokeless tobacco. He reports that he does not drink alcohol and does not use drugs.  No Known Allergies  Family History  Problem Relation Age of Onset  . Hypertension Father   . Lung cancer Father      Prior to Admission medications   Medication Sig Start Date End Date Taking? Authorizing Provider  HYDROcodone-acetaminophen (NORCO/VICODIN) 5-325 MG tablet Take 1-2 tablets by mouth every 8 (eight) hours as needed. 08/16/19   Maxwell Caul, PA-C  ibuprofen (ADVIL) 200 MG tablet Take 400 mg by mouth every 6 (six) hours as needed for moderate pain.    [provider]  tiZANidine (ZANAFLEX) 4 MG tablet Take 1-4 mg by mouth 4 (four) times daily. 08/15/19   [provider]    Physical Exam: Vitals:   08/29/20  0300 08/29/20 0330 08/29/20 0352 08/29/20 0400  BP: (!) 171/92 (!) 164/92 (!) 160/94 (!) 155/94  Pulse: 85 84 82 85  Resp: (!) 24 (!) 33 (!) 29 (!) 30  Temp:      TempSrc:      SpO2: 93% 94% 95% 92%    Constitutional: NAD, calm, comfortable Eyes: PERRL, lids and conjunctivae normal ENMT: Mucous membranes are moist. Posterior pharynx clear of any exudate or lesions.Normal dentition.  Neck: normal, supple, no masses, no thyromegaly Respiratory: clear to auscultation bilaterally, no wheezing, no crackles. Normal respiratory effort. No accessory muscle use.  Cardiovascular: Regular rate and rhythm, no murmurs / rubs / gallops. No extremity edema. 2+ pedal pulses. No carotid bruits.  Abdomen: no tenderness, no masses palpated. No hepatosplenomegaly. Bowel sounds positive.  Musculoskeletal: no clubbing / cyanosis. No joint deformity upper and lower extremities. Good ROM, no contractures. Normal muscle tone.  Skin: no rashes, lesions, ulcers. No induration Neurologic: CN 2-12 grossly intact. Sensation intact, DTR normal. Strength 5/5 in all 4.  Psychiatric: Normal judgment and insight. Alert and oriented x 3. Normal mood.    Labs on Admission: I have personally reviewed following labs and imaging studies  CBC: Recent Labs  Lab 08/28/20 1355  WBC 10.1  NEUTROABS 8.5*  HGB 15.7  HCT 46.4  MCV 79.5*  PLT 199   Basic Metabolic Panel: Recent Labs  Lab 08/28/20 1355  NA 129*  K 3.7  CL 93*  CO2 23  GLUCOSE 295*  BUN 13  CREATININE 0.75  CALCIUM 8.3*   GFR: CrCl cannot be calculated (Unknown ideal weight.). Liver Function Tests: Recent Labs  Lab 08/28/20 1355  AST 43*  ALT 35  ALKPHOS 49  BILITOT 0.9  PROT 7.3  ALBUMIN 3.4*   No results for input(s): LIPASE, AMYLASE in the last 168 hours. No results for input(s): AMMONIA in the last 168 hours. Coagulation Profile: No results for input(s): INR, PROTIME in the last 168 hours. Cardiac Enzymes: No results for input(s):  CKTOTAL, CKMB, CKMBINDEX, TROPONINI in the last 168 hours. BNP (last 3 results) No results for input(s): PROBNP in the last 8760 hours. HbA1C: No results for input(s): HGBA1C in the last 72 hours. CBG: No results for input(s): GLUCAP in the last 168 hours. Lipid Profile: Recent Labs    08/28/20 1355  TRIG 137   Thyroid Function Tests: No results for input(s): TSH, T4TOTAL, FREET4, T3FREE, THYROIDAB in the last 72 hours. Anemia Panel: Recent Labs    08/28/20 1355  FERRITIN 582*   Urine analysis: No results found for: COLORURINE, APPEARANCEUR, LABSPEC, PHURINE, GLUCOSEU, HGBUR, BILIRUBINUR, KETONESUR, PROTEINUR, UROBILINOGEN, NITRITE, LEUKOCYTESUR  Radiological Exams on Admission: DG Chest 2 View  Result Date: 08/28/2020 CLINICAL DATA:  Short of breath. Diagnosed with COVID 19 infection 1 week ago. EXAM: CHEST - 2 VIEW COMPARISON:  08/16/2019 FINDINGS: There hazy bilateral airspace lung opacities consistent with multifocal pneumonia. No pulmonary edema. No convincing pleural effusion or pneumothorax. Heart, mediastinum and hila are unremarkable. Skeletal structures are grossly intact. IMPRESSION: 1. Hazy bilateral airspace lung opacities consistent with multifocal pneumonia, pattern compatible with COVID-19 infection. Electronically Signed   By: Amie Portland M.D.   On: 08/28/2020 13:23    EKG: Independently reviewed.  Assessment/Plan Principal Problem:   Acute hypoxemic respiratory failure due to COVID-19 [facility]) Active Problems:   Morbid obesity with BMI of 50.0-59.9, adult (HCC)   Pneumonia due to COVID-19 virus   Diabetes mellitus due to underlying condition (HCC)    1. Acute hypoxic resp failure due to COVID-19 1. COVID pathway 2. remdesivir 3. Decadron 4. Currently satting mid 90s on 4L via [place] 5. Tele monitor 6. Cont pulse ox 7. remdesivir or barcitinib if he worsens but holding off for the moment as he is requiring less than 6L to maintain sats 8. Daily labs 2. DM2  - 1. Unclear if undx DM at baseline or just hyperglycemic because he got steroids 2. A1C pending 3. Mod scale SSI AC/HS, may need to increase though  DVT prophylaxis: Lovenox Code Status: Full Family Communication: No family in room Disposition Plan: Home after O2 requirement improved Consults called: None Admission status: Admit to inpatient  Severity of Illness: The appropriate patient status for this patient is INPATIENT. Inpatient status is judged to be reasonable and necessary in order to provide the required intensity of service to ensure the patient's safety. The patient's presenting symptoms, physical exam findings, and initial radiographic and laboratory data in the context of their chronic comorbidities is felt to place them at high risk for further clinical deterioration. Furthermore, it is not anticipated that the patient will be medically stable for discharge from the hospital within 2 midnights of admission. The following factors support the patient status of inpatient.   IP status due to COVID-19 with new O2 requirement.   * I certify that at the point of admission it is my clinical judgment  that the patient will require inpatient hospital care spanning beyond 2 midnights from the point of admission due to high intensity of service, high risk for further deterioration and high frequency of surveillance required.*    Zen Felling M. DO [facility]  How to contact the [facility] Attending or Consulting provider 7A - 7P or covering provider during after hours 7P -7A, for this patient?  1. Check the care team in [facility] and look for a) attending/consulting TR[facility] provider listed and b) the [facility] team listed 2. Log into [email or web address]  Amion Physician Scheduling and messaging for groups and whole hospitals  On call and physician scheduling software for group practices, residents, hospitalists and other medical providers for call, clinic, rotation and shift schedules. On[employer]  is a hospital-wide system for scheduling doctors and paging doctors on call. EasyPlot is for scientific plotting and data analysis.  [email or web address]  and use [facility]'s universal password to access. If you do not have the password, please contact the hospital operator.  3. Locate the [facility] provider you are looking for under [facility] and page to a number that you can be directly reached. 4. If you still have difficulty reaching the provider, please page the [facility] (Director on Call) for the Hospitalists listed on amion for assistance.  08/29/2020, 5:35 AM

## 2020-08-29 NOTE — ED Notes (Signed)
Attempted report second time. RN busy. Left callback number and informed unit staff pt is en route.

## 2020-08-29 NOTE — ED Notes (Signed)
[facility] here for transport.  

## 2020-08-30 LAB — COMPREHENSIVE METABOLIC PANEL
ALT: 28 U/L (ref 0–44)
AST: 30 U/L (ref 15–41)
Albumin: 2.8 g/dL — ABNORMAL LOW (ref 3.5–5.0)
Alkaline Phosphatase: 49 U/L (ref 38–126)
Anion gap: 11 (ref 5–15)
BUN: 13 mg/dL (ref 6–20)
CO2: 27 mmol/L (ref 22–32)
Calcium: 8.7 mg/dL — ABNORMAL LOW (ref 8.9–10.3)
Chloride: 97 mmol/L — ABNORMAL LOW (ref 98–111)
Creatinine, Ser: 0.79 mg/dL (ref 0.61–1.24)
GFR calc Af Amer: >60 mL/min (ref >60)
GFR calc non Af Amer: >60 mL/min (ref >60)
Glucose, Bld: 303 mg/dL — ABNORMAL HIGH (ref 70–99)
Potassium: 3.7 mmol/L (ref 3.5–5.1)
Sodium: 135 mmol/L (ref 135–145)
Total Bilirubin: 0.8 mg/dL (ref 0.3–1.2)
Total Protein: 6.5 g/dL (ref 6.5–8.1)

## 2020-08-30 LAB — D-DIMER, QUANTITATIVE: D-Dimer, Quant: 0.84 ug/mL-FEU — ABNORMAL HIGH (ref 0.00–0.50)

## 2020-08-30 LAB — GLUCOSE, CAPILLARY
Glucose-Capillary: 320 mg/dL — ABNORMAL HIGH (ref 70–99)
Glucose-Capillary: 383 mg/dL — ABNORMAL HIGH (ref 70–99)
Glucose-Capillary: 333 mg/dL — ABNORMAL HIGH (ref 70–99)
Glucose-Capillary: 368 mg/dL — ABNORMAL HIGH (ref 70–99)

## 2020-08-30 LAB — CBC WITH DIFFERENTIAL/PLATELET
Abs Immature Granulocytes: 0.04 10*3/uL (ref 0.00–0.07)
Basophils Absolute: 0 10*3/uL (ref 0.0–0.1)
Basophils Relative: 0 %
Eosinophils Absolute: 0 10*3/uL (ref 0.0–0.5)
Eosinophils Relative: 0 %
HCT: 46.3 % (ref 39.0–52.0)
Hemoglobin: 15.4 g/dL (ref 13.0–17.0)
Immature Granulocytes: 1 %
Lymphocytes Relative: 10 %
Lymphs Abs: 0.6 10*3/uL — ABNORMAL LOW (ref 0.7–4.0)
MCH: 27 pg (ref 26.0–34.0)
MCHC: 33.3 g/dL (ref 30.0–36.0)
MCV: 81.1 fL (ref 80.0–100.0)
Monocytes Absolute: 0.2 10*3/uL (ref 0.1–1.0)
Monocytes Relative: 3 %
Neutro Abs: 5.1 10*3/uL (ref 1.7–7.7)
Neutrophils Relative %: 86 %
Platelets: 207 10*3/uL (ref 150–400)
RBC: 5.71 MIL/uL (ref 4.22–5.81)
RDW: 12.7 % (ref 11.5–15.5)
WBC: 5.9 10*3/uL (ref 4.0–10.5)
nRBC: 0 % (ref 0.0–0.2)

## 2020-08-30 LAB — C-REACTIVE PROTEIN: CRP: 14.6 mg/dL — ABNORMAL HIGH (ref <1.0)

## 2020-08-30 MED ORDER — INSULIN DETEMIR 100 UNIT/ML ~~LOC~~ SOLN
15.0000 [IU] | Freq: Two times a day (BID) | SUBCUTANEOUS | Status: DC
  Administered 2020-08-30 – 2020-08-31 (×3): 15 [IU] via SUBCUTANEOUS
  Filled 2020-08-30 (×5): qty 0.15

## 2020-08-30 MED ORDER — AMLODIPINE BESYLATE 5 MG PO TABS
5.0000 mg | ORAL_TABLET | Freq: Every day | ORAL | Status: DC
  Administered 2020-08-30 – 2020-09-01 (×3): 5 mg via ORAL
  Filled 2020-08-30 (×3): qty 1

## 2020-08-30 MED ORDER — INSULIN ASPART 100 UNIT/ML ~~LOC~~ SOLN
6.0000 [IU] | Freq: Three times a day (TID) | SUBCUTANEOUS | Status: DC
  Administered 2020-08-30 – 2020-08-31 (×5): 6 [IU] via SUBCUTANEOUS

## 2020-08-30 MED ORDER — ENOXAPARIN SODIUM 80 MG/0.8ML ~~LOC~~ SOLN
65.0000 mg | SUBCUTANEOUS | Status: DC
  Administered 2020-08-31: 65 mg via SUBCUTANEOUS
  Filled 2020-08-30: qty 0.8

## 2020-08-30 NOTE — Progress Notes (Signed)
RT note. Pt. Refused CPAP tonight. Pt. Placed on 4LNC per pt. He wears that much at night time. Pt. Sat 90% resting comfortable. RT will continue to monitor.

## 2020-08-30 NOTE — Progress Notes (Signed)
Placed patient on CPAP via [facility], auto settings 18.0, min 5.0) cm H2O with 4Lpm O2 bleed in. Tolerating well at this time.

## 2020-08-30 NOTE — Progress Notes (Signed)
PROGRESS NOTE                                                                                                                                                                                                             Patient Demographics:    Eric Rios, is a 49 y.o. male, DOB - September 20, 1971, JXB:[id number]  Outpatient Primary MD for the patient is Doreene Nest, NP   Admit date - 08/28/2020   LOS - 1  Chief Complaint  Patient presents with  . Covid Positive  . Shortness of Breath       Brief Narrative: Patient is a 49 y.o. male with PMHx morbid obesity, OSA (on CPAP at home)-diagnosed with COVID-19 on 8/28 presented to the hospital with worsening shortness of breath-found to have acute hypoxic respiratory failure secondary to COVID-19 pneumonia.  COVID-19 vaccinated status: Unvaccinated  Significant Events: 9/4>> Admit to [facility] for hypoxia due to COVID-19 pneumonia  Significant studies: 9/4>>Chest x-ray: Hazy bilateral airspace lung opacities consistent with multifocal pneumonia  COVID-19 medications: Steroids: 9/4>> Remdesivir: 9/4>>  Antibiotics: None  Microbiology data: 9/4 >>blood culture: No growth  Procedures: None  Consults: None  DVT prophylaxis:    Subjective:   Feels slightly better-could not tolerate CPAP last night-hence placed on 4 L.  Stable on 2 L this morning.   Assessment  & Plan :   Acute Hypoxic Resp Failure due to Covid 19 Viral pneumonia: Some improvement-but stable on just 2 L of oxygen.  Continue steroids and Remdesivir.  If he worsens-we will start either baricitinib or Actemra.  .  Note-rationale/risk/benefits of both Actemra/baricitinib (patient aware use under EUA by FDA) discussed with patient-he has no history of TB, hepatitis B.  He has no history of smoldering diverticulitis.  He is aware of the risk of VTE with baricitinib.  He consents to the use of these agents  if his hypoxia worsens.   Fever: afebrile O2 requirements:  SpO2: 97 % O2 Flow Rate (L/min): 4 L/min   COVID-19 Labs: Recent Labs    08/28/20 1355 08/29/20 0938 08/30/20 0328  DDIMER 0.46 0.59* 0.84*  FERRITIN 582*  --   --   LDH 251*  --   --   CRP 9.6* 17.2* 14.6*    No results found for: BNP  Recent Labs  Lab 08/28/20 1355  PROCALCITON <0.10    No results found for: SARSCOV2NAA   Prone/Incentive Spirometry: encouraged  incentive spirometry use 3-4/hour.  DM-2 (A1c 10.8) with steroid-induced hyperglycemia: CBGs remain uncontrolled-increase Levemir to 15 units twice daily, increase NovoLog Premeal to 6 units-continue resistant SSI and follow.  Will adjust accordingly tomorrow.  Recent Labs    08/29/20 1700 08/29/20 2116 08/30/20 0824  GLUCAP 280* 346* 320*   HTN: BP remains on the higher side-start amlodipine 5 mg and follow tomorrow.  OSA: On CPAP at home which he uses irregularly-tried using CPAP yesterday-but intolerant-I have asked him to retry today-if he does not try-okay to put him on 4-5 L of oxygen at night. t.  Morbid Obesity: Estimated body mass index is 47.61 kg/m as calculated from the following:   Height as of this encounter: 5\' 6"  (1.676 m).   Weight as of this encounter: 133.8 kg.    ABG: No results found for: PHART, PCO2ART, PO2ART, HCO3, TCO2, ACIDBASEDEF, O2SAT  Vent Settings: N/A   Condition -Stable  Family Communication  :  Spouse (Brittney [phone])-left a voicemail on 7/6  Code Status :  Full Code  Diet :  Diet Order            Diet Carb Modified Fluid consistency: Thin; Room service appropriate? Yes  Diet effective now                  Disposition Plan  :   Status is: Inpatient  Remains inpatient appropriate because:Inpatient level of care appropriate due to severity of illness   Dispo: The patient is from: Home              Anticipated d/c is to: Home              Anticipated d/c date is: 2 day               Patient currently is not medically stable to d/c.   Barriers to discharge: Hypoxia requiring O2 supplementation/complete 5 days of IV Remdesivir  Antimicorbials  :    Anti-infectives (From admission, onward)   Start     Dose/Rate Route Frequency Ordered Stop   08/29/20 1000  remdesivir 100 mg in sodium chloride 0.9 % 100 mL IVPB        100 mg 200 mL/hr over 30 Minutes Intravenous Daily 08/28/20 1452 09/02/20 0959   08/29/20 0515  nafcillin 12 g in sodium chloride 0.9 % 500 mL continuous infusion  Status:  Discontinued        12 g 20.8 mL/hr over 24 Hours Intravenous Every 24 hours 08/29/20 0503 08/29/20 0505   08/28/20 1500  remdesivir 100 mg in sodium chloride 0.9 % 100 mL IVPB        100 mg 200 mL/hr over 30 Minutes Intravenous Every 30 min 08/28/20 1452 08/28/20 1616      Inpatient Medications  Scheduled Meds: . [START ON 08/31/2020] enoxaparin (LOVENOX) injection  65 mg Subcutaneous Q24H  . insulin aspart  0-20 Units Subcutaneous TID WC  . insulin aspart  0-5 Units Subcutaneous QHS  . insulin aspart  6 Units Subcutaneous TID WC  . insulin detemir  15 Units Subcutaneous BID  . methylPREDNISolone (SOLU-MEDROL) injection  60 mg Intravenous Q12H   Continuous Infusions: . sodium chloride Stopped (08/29/20 0000)  . remdesivir 100 mg in NS 100 mL 100 mg (08/30/20 0932)   PRN Meds:.sodium chloride, acetaminophen, chlorpheniramine-HYDROcodone, guaiFENesin-dextromethorphan, ondansetron **OR** ondansetron (ZOFRAN) IV   Time Spent  in minutes  25  See all Orders from today for further details   Jeoffrey Massed M.D on 08/30/2020 at 11:54 AM  To page go to [email or web address] - use universal password  [facility] -  Office  [phone]    Objective:   Vitals:   08/30/20 0024 08/30/20 0400 08/30/20 0826 08/30/20 1100  BP: (!) 151/91 (!) 144/80 (!) 158/84   Pulse: 87 92 97   Resp: (!) 23 (!) 24 (!) 23   Temp: 98.6 F (37 C) 97.7 F (36.5 C) 98.4 F (36.9 C)   TempSrc:  Axillary Axillary Oral   SpO2: 97% 90% 97%   Weight:    133.8 kg  Height:    5\' 6"  (1.676 m)    Wt Readings from Last 3 Encounters:  08/30/20 133.8 kg  01/26/17 (!) 142.4 kg  10/16/16 (!) 147.1 kg     Intake/Output Summary (Last 24 hours) at 08/30/2020 1154 Last data filed at 08/30/2020 1100 Gross per 24 hour  Intake 240 ml  Output 1450 ml  Net -1210 ml     Physical Exam Gen Exam:Alert awake-not in any distress HEENT:atraumatic, normocephalic Chest: B/L clear to auscultation anteriorly CVS:S1S2 regular Abdomen:soft non tender, non distended Extremities:no edema Neurology: Non focal Skin: no rash   Data Review:    CBC Recent Labs  Lab 08/28/20 1355 08/29/20 0938 08/30/20 0328  WBC 10.1 9.5 5.9  HGB 15.7 14.5 15.4  HCT 46.4 44.6 46.3  PLT 199 174 207  MCV 79.5* 80.5 81.1  MCH 26.9 26.2 27.0  MCHC 33.8 32.5 33.3  RDW 12.7 12.7 12.7  LYMPHSABS 1.0 1.3 0.6*  MONOABS 0.6 0.5 0.2  EOSABS 0.0 0.0 0.0  BASOSABS 0.0 0.0 0.0    Chemistries  Recent Labs  Lab 08/28/20 1355 08/29/20 0938 08/30/20 0328  NA 129* 135 135  K 3.7 3.7 3.7  CL 93* 98 97*  CO2 23 27 27   GLUCOSE 295* 274* 303*  BUN 13 11 13   CREATININE 0.75 0.75 0.79  CALCIUM 8.3* 8.7* 8.7*  AST 43* 36 30  ALT 35 32 28  ALKPHOS 49 47 49  BILITOT 0.9 0.8 0.8   ------------------------------------------------------------------------------------------------------------------ Recent Labs    08/28/20 1355  TRIG 137    Lab Results  Component Value Date   HGBA1C 10.8 (H) 08/29/2020   ------------------------------------------------------------------------------------------------------------------ No results for input(s): TSH, T4TOTAL, T3FREE, THYROIDAB in the last 72 hours.  Invalid input(s): FREET3 ------------------------------------------------------------------------------------------------------------------ Recent Labs    08/28/20 1355  FERRITIN 582*    Coagulation profile No  results for input(s): INR, PROTIME in the last 168 hours.  Recent Labs    08/29/20 0938 08/30/20 0328  DDIMER 0.59* 0.84*    Cardiac Enzymes No results for input(s): CKMB, TROPONINI, MYOGLOBIN in the last 168 hours.  Invalid input(s): CK ------------------------------------------------------------------------------------------------------------------ No results found for: BNP  Micro Results Recent Results (from the past 240 hour(s))  Blood Culture (routine x 2)     Status: None (Preliminary result)   Collection Time: 08/28/20  1:50 PM   Specimen: Right Antecubital; Blood  Result Value Ref Range Status   Specimen Description   Final    RIGHT ANTECUBITAL Performed at [facility], [address] [place], [facility] [address]    Special Requests   Final    BOTTLES DRAWN AEROBIC AND ANAEROBIC Blood Culture results may not be optimal due to an inadequate volume of blood received in culture bottles Performed at [facility], [address] [place], [place] [address]    Culture   Final    NO GROWTH 2 DAYS Performed at [facility], [address], [place], [place] [address]    Report Status PENDING  Incomplete  Blood Culture (routine x 2)     Status: None (Preliminary result)   Collection Time: 08/28/20  2:20 PM   Specimen: BLOOD RIGHT HAND  Result Value Ref Range Status   Specimen Description   Final    BLOOD RIGHT HAND Performed at [facility], [address] [place], [place] [address]    Special Requests   Final    BOTTLES DRAWN AEROBIC AND ANAEROBIC Blood Culture results may not be optimal due to an inadequate volume of blood received in culture bottles Performed at [facility], [address] [place], [place] [address]    Culture   Final    NO GROWTH 2 DAYS Performed at [facility], [address], [place], [place] [address]    Report Status PENDING  Incomplete    Radiology Reports DG Chest 2  View  Result Date: 08/28/2020 CLINICAL DATA:  Short of breath. Diagnosed with COVID 19 infection 1 week ago. EXAM: CHEST - 2 VIEW COMPARISON:  08/16/2019 FINDINGS: There hazy bilateral airspace lung opacities consistent with multifocal pneumonia. No pulmonary edema. No convincing pleural effusion or pneumothorax. Heart, mediastinum and hila are unremarkable. Skeletal structures are grossly intact. IMPRESSION: 1. Hazy bilateral airspace lung opacities consistent with multifocal pneumonia, pattern compatible with COVID-19 infection. Electronically Signed   By: Amie Portland M.D.   On: 08/28/2020 13:23

## 2020-08-30 NOTE — Progress Notes (Signed)
Patient removed CPAP d/t intolerance. Placed on 4L [place].

## 2020-08-31 ENCOUNTER — Inpatient Hospital Stay (HOSPITAL_COMMUNITY): Payer: Managed Care, Other (non HMO)

## 2020-08-31 DIAGNOSIS — M7989 Other specified soft tissue disorders: Secondary | ICD-10-CM

## 2020-08-31 DIAGNOSIS — R609 Edema, unspecified: Secondary | ICD-10-CM

## 2020-08-31 LAB — GLUCOSE, CAPILLARY
Glucose-Capillary: 310 mg/dL — ABNORMAL HIGH (ref 70–99)
Glucose-Capillary: 312 mg/dL — ABNORMAL HIGH (ref 70–99)
Glucose-Capillary: 354 mg/dL — ABNORMAL HIGH (ref 70–99)
Glucose-Capillary: 367 mg/dL — ABNORMAL HIGH (ref 70–99)

## 2020-08-31 LAB — COMPREHENSIVE METABOLIC PANEL
ALT: 23 U/L (ref 0–44)
AST: 22 U/L (ref 15–41)
Albumin: 2.8 g/dL — ABNORMAL LOW (ref 3.5–5.0)
Alkaline Phosphatase: 44 U/L (ref 38–126)
Anion gap: 11 (ref 5–15)
BUN: 18 mg/dL (ref 6–20)
CO2: 25 mmol/L (ref 22–32)
Calcium: 8.9 mg/dL (ref 8.9–10.3)
Chloride: 98 mmol/L (ref 98–111)
Creatinine, Ser: 0.7 mg/dL (ref 0.61–1.24)
GFR calc Af Amer: >60 mL/min (ref >60)
GFR calc non Af Amer: >60 mL/min (ref >60)
Glucose, Bld: 289 mg/dL — ABNORMAL HIGH (ref 70–99)
Potassium: 3.8 mmol/L (ref 3.5–5.1)
Sodium: 134 mmol/L — ABNORMAL LOW (ref 135–145)
Total Bilirubin: 0.5 mg/dL (ref 0.3–1.2)
Total Protein: 6.5 g/dL (ref 6.5–8.1)

## 2020-08-31 LAB — CBC WITH DIFFERENTIAL/PLATELET
Abs Immature Granulocytes: 0 10*3/uL (ref 0.00–0.07)
Basophils Absolute: 0 10*3/uL (ref 0.0–0.1)
Basophils Relative: 0 %
Eosinophils Absolute: 0 10*3/uL (ref 0.0–0.5)
Eosinophils Relative: 0 %
HCT: 45.4 % (ref 39.0–52.0)
Hemoglobin: 15.1 g/dL (ref 13.0–17.0)
Lymphocytes Relative: 3 %
Lymphs Abs: 0.3 10*3/uL — ABNORMAL LOW (ref 0.7–4.0)
MCH: 27 pg (ref 26.0–34.0)
MCHC: 33.3 g/dL (ref 30.0–36.0)
MCV: 81.2 fL (ref 80.0–100.0)
Monocytes Absolute: 0.6 10*3/uL (ref 0.1–1.0)
Monocytes Relative: 6 %
Neutro Abs: 9.6 10*3/uL — ABNORMAL HIGH (ref 1.7–7.7)
Neutrophils Relative %: 91 %
Platelets: 270 10*3/uL (ref 150–400)
RBC: 5.59 MIL/uL (ref 4.22–5.81)
RDW: 12.6 % (ref 11.5–15.5)
WBC: 10.5 10*3/uL (ref 4.0–10.5)
nRBC: 0 % (ref 0.0–0.2)
nRBC: 0 /100 WBC

## 2020-08-31 LAB — D-DIMER, QUANTITATIVE: D-Dimer, Quant: 1.18 ug/mL-FEU — ABNORMAL HIGH (ref 0.00–0.50)

## 2020-08-31 LAB — C-REACTIVE PROTEIN: CRP: 6.8 mg/dL — ABNORMAL HIGH (ref <1.0)

## 2020-08-31 MED ORDER — INSULIN STARTER KIT- PEN NEEDLES (ENGLISH)
1.0000 | Freq: Once | Status: AC
  Administered 2020-08-31: 1
  Filled 2020-08-31: qty 1

## 2020-08-31 MED ORDER — INSULIN DETEMIR 100 UNIT/ML ~~LOC~~ SOLN
20.0000 [IU] | Freq: Two times a day (BID) | SUBCUTANEOUS | Status: DC
  Administered 2020-08-31 – 2020-09-01 (×2): 20 [IU] via SUBCUTANEOUS
  Filled 2020-08-31 (×4): qty 0.2

## 2020-08-31 MED ORDER — INSULIN ASPART 100 UNIT/ML ~~LOC~~ SOLN
8.0000 [IU] | Freq: Three times a day (TID) | SUBCUTANEOUS | Status: DC
  Administered 2020-08-31 – 2020-09-01 (×3): 8 [IU] via SUBCUTANEOUS

## 2020-08-31 MED ORDER — LIVING WELL WITH DIABETES BOOK
Freq: Once | Status: AC
  Filled 2020-08-31 (×2): qty 1

## 2020-08-31 MED ORDER — INSULIN DETEMIR 100 UNIT/ML ~~LOC~~ SOLN
18.0000 [IU] | Freq: Two times a day (BID) | SUBCUTANEOUS | Status: DC
  Filled 2020-08-31: qty 0.18

## 2020-08-31 MED ORDER — ENOXAPARIN SODIUM 150 MG/ML ~~LOC~~ SOLN
1.0000 mg/kg | Freq: Two times a day (BID) | SUBCUTANEOUS | Status: DC
  Administered 2020-08-31 – 2020-09-01 (×2): 135 mg via SUBCUTANEOUS
  Filled 2020-08-31 (×2): qty 0.9

## 2020-08-31 MED ORDER — FUROSEMIDE 40 MG PO TABS
40.0000 mg | ORAL_TABLET | Freq: Once | ORAL | Status: AC
  Administered 2020-08-31: 40 mg via ORAL
  Filled 2020-08-31: qty 1

## 2020-08-31 MED ORDER — ENOXAPARIN SODIUM 80 MG/0.8ML ~~LOC~~ SOLN
70.0000 mg | Freq: Once | SUBCUTANEOUS | Status: AC
  Administered 2020-08-31: 70 mg via SUBCUTANEOUS
  Filled 2020-08-31: qty 0.8

## 2020-08-31 MED ORDER — METHYLPREDNISOLONE SODIUM SUCC 40 MG IJ SOLR
40.0000 mg | Freq: Two times a day (BID) | INTRAMUSCULAR | Status: DC
  Administered 2020-08-31: 40 mg via INTRAVENOUS
  Filled 2020-08-31: qty 1

## 2020-08-31 NOTE — Progress Notes (Signed)
RTnote. Pt. Refusing CPAP at this time. Pt. Said pressure is too much and does not want to wear hospitals CPAP. Pt. Resting comfortable and satting 96%. RT will continue to monitor. CPAP removed from room.

## 2020-08-31 NOTE — TOC Initial Note (Signed)
Transition of Care [facility]) - Initial/Assessment Note    Patient Details  Name: Eric Rios MRN: [id number] Date of Birth: May 18, 1971  Transition of Care [facility]) CM/SW Contact:    Lockie Pares, RN Phone Number: 08/31/2020, 2:13 PM  Clinical Narrative:                 Admitted with COVID pneumonia day 3 continue medications IV Is on CPAP at home at night for OSA, unable to tolerate here, increasing oxygen at night. Currently on 2LPM during day and 4 LPM at night. Will follow for needs  Expected Discharge Plan: Home/Self Care Barriers to Discharge: Continued Medical Work up   Patient Goals and CMS Choice        Expected Discharge Plan and Services Expected Discharge Plan: Home/Self Care   Discharge Planning Services: CM Consult   Living arrangements for the past 2 months: Single Family Home                                      Prior Living Arrangements/Services Living arrangements for the past 2 months: Single Family Home Lives with:: Spouse Patient language and need for interpreter reviewed:: Yes        Need for Family Participation in Patient Care: Yes (Comment) Care giver support system in place?: Yes (comment)   Criminal Activity/Legal Involvement Pertinent to Current Situation/Hospitalization: No - Comment as needed  Activities of Daily Living Home Assistive Devices/Equipment: None ADL Screening (condition at time of admission) Patient's cognitive ability adequate to safely complete daily activities?: Yes Is the patient deaf or have difficulty hearing?: No Does the patient have difficulty seeing, even when wearing glasses/contacts?: No Does the patient have difficulty concentrating, remembering, or making decisions?: No Patient able to express need for assistance with ADLs?: Yes Does the patient have difficulty dressing or bathing?: No Independently performs ADLs?: Yes (appropriate for developmental age) Does the patient have difficulty walking or  climbing stairs?: No Weakness of Legs: None Weakness of Arms/Hands: None  Permission Sought/Granted                  Emotional Assessment       Orientation: : Oriented to Self, Oriented to Place, Oriented to  Time, Oriented to Situation Alcohol / Substance Use: Not Applicable Psych Involvement: No (comment)  Admission diagnosis:  Hypoxia [R09.02] Pneumonia due to COVID-19 virus [U07.1, J12.82] COVID-19 [U07.1] Acute hypoxemic respiratory failure due to COVID-19 (HCC) [U07.1, J96.01] Patient Active Problem List   Diagnosis Date Noted  . Acute hypoxemic respiratory failure due to COVID-19 (HCC) 08/29/2020  . Diabetes mellitus due to underlying condition (HCC) 08/29/2020  . Pneumonia due to COVID-19 virus 08/28/2020  . Influenza 01/26/2017  . OSA (obstructive sleep apnea) 10/16/2016  . Hypersomnia 08/07/2016  . Obesity 08/07/2016  . Encounter for preventative adult health care exam with abnormal findings 05/15/2016  . Left-sided low back pain without sciatica 05/15/2016  . Other fatigue 05/15/2016  . Elevated blood pressure 05/15/2016  . Morbid obesity with BMI of 50.0-59.9, adult (HCC) 05/15/2016   PCP:  Doreene Nest, NP Pharmacy:   [facility] [address] [place] [facility] [address] [place] [address] Phone: [phone] Fax: [phone]     Social Determinants of Health (SDOH) Interventions    Readmission Risk Interventions No flowsheet data found.

## 2020-08-31 NOTE — Progress Notes (Signed)
Lower ext. study completed.   See CVProc for preliminary results.   Caz Weaver, RDMS, RVT 

## 2020-08-31 NOTE — Progress Notes (Addendum)
PROGRESS NOTE                                                                                                                                                                                                             Patient Demographics:    Eric Rios, is a 49 y.o. male, DOB - [birth year], GEZ:[id number]  Outpatient Primary MD for the patient is Doreene Nest, NP   Admit date - 08/28/2020   LOS - 2  Chief Complaint  Patient presents with  . Covid Positive  . Shortness of Breath       Brief Narrative: Patient is a 49 y.o. male with PMHx morbid obesity, OSA (on CPAP at home)-diagnosed with COVID-19 on 8/28 presented to the hospital with worsening shortness of breath-found to have acute hypoxic respiratory failure secondary to COVID-19 pneumonia.  COVID-19 vaccinated status: Unvaccinated  Significant Events: 9/4>> Admit to [facility] for hypoxia due to COVID-19 pneumonia  Significant studies: 9/4>>Chest x-ray: Hazy bilateral airspace lung opacities consistent with multifocal pneumonia 9/7>>B/L Lower ext doppler: acute deep vein thrombosis involving the left gastrocnemius veins (prelim).   COVID-19 medications: Steroids: 9/4>> Remdesivir: 9/4>>  Antibiotics: None  Microbiology data: 9/4 >>blood culture: No growth  Procedures: None  Consults: None  DVT prophylaxis: Lovenox-switch to full therapeutic dose today   Subjective:   Titrated to room air today-feels better. No SOB at rest. Complaining of pain the right lower ext   Assessment  & Plan :   Acute Hypoxic Resp Failure due to Covid 19 Viral pneumonia: Improved-titrated to room air at rest-remains on steroids and remdesivir. Ambulate with nursing staff-suspect may require home O2 on discharge.   Note-rationale/risk/benefits of both Actemra/baricitinib (patient aware use under EUA by FDA) discussed with patient-he has no history of TB, hepatitis B.   He has no history of smoldering diverticulitis.  He is aware of the risk of VTE with baricitinib.  He consents to the use of these agents if his hypoxia worsens.   Fever: afebrile O2 requirements:  SpO2: 90 % O2 Flow Rate (L/min): 4 L/min   COVID-19 Labs: Recent Labs    08/29/20 0938 08/30/20 0328 08/31/20 0315  DDIMER 0.59* 0.84* 1.18*  CRP 17.2* 14.6* 6.8*    No results found for: BNP  Recent Labs  Lab 08/28/20 1355  PROCALCITON <0.10  No results found for: SARSCOV2NAA   Prone/Incentive Spirometry: encouraged  incentive spirometry use 3-4/hour.  DM-2 (A1c 10.8) with steroid-induced hyperglycemia: appears to be a new diagnoses.CBG's still elevated-but with decrease in steroids-should improve-in the meantime increase Levemir to 20 units, increase pre-meal novolog to 8 units-and follow. Will plan on discharging on Insulin and metformin. Have asked RN to provide diabetic teaching etc.   Recent Labs    08/30/20 2059 08/31/20 0823 08/31/20 1154  GLUCAP 368* 310* 312*   HTN: BP stable-continue Amlodipine.   Left lower extremity DVT: seen on Doppler 9/7-will switch to full dose Lovenox. Will switch to oral agent on discharge.   OSA: On CPAP at home which he uses irregularly-due to intolerance. Tried using CPAP during this hospitalization-but intolerant.. Have asked patient to follow up with his PCP to get another CPAP that he may be able to tolerate well.  Morbid Obesity: Estimated body mass index is 47.61 kg/m as calculated from the following:   Height as of this encounter: 5\' 6"  (1.676 m).   Weight as of this encounter: 133.8 kg.    ABG: No results found for: PHART, PCO2ART, PO2ART, HCO3, TCO2, ACIDBASEDEF, O2SAT  Vent Settings: N/A   Condition -Stable  Family Communication  :  Spouse (Brittney [phone])-updated on 9/7  Code Status :  Full Code  Diet :  Diet Order            Diet Carb Modified Fluid consistency: Thin; Room service appropriate? Yes   Diet effective now                  Disposition Plan  :   Status is: Inpatient  Remains inpatient appropriate because:Inpatient level of care appropriate due to severity of illness   Dispo: The patient is from: Home              Anticipated d/c is to: Home              Anticipated d/c date is: 2 day              Patient currently is not medically stable to d/c.   Barriers to discharge: Hypoxia requiring O2 supplementation/complete 5 days of IV Remdesivir  Antimicorbials  :    Anti-infectives (From admission, onward)   Start     Dose/Rate Route Frequency Ordered Stop   08/29/20 1000  remdesivir 100 mg in sodium chloride 0.9 % 100 mL IVPB        100 mg 200 mL/hr over 30 Minutes Intravenous Daily 08/28/20 1452 09/02/20 0959   08/29/20 0515  nafcillin 12 g in sodium chloride 0.9 % 500 mL continuous infusion  Status:  Discontinued        12 g 20.8 mL/hr over 24 Hours Intravenous Every 24 hours 08/29/20 0503 08/29/20 0505   08/28/20 1500  remdesivir 100 mg in sodium chloride 0.9 % 100 mL IVPB        100 mg 200 mL/hr over 30 Minutes Intravenous Every 30 min 08/28/20 1452 08/28/20 1616      Inpatient Medications  Scheduled Meds: . amLODipine  5 mg Oral Daily  . enoxaparin (LOVENOX) injection  65 mg Subcutaneous Q24H  . insulin aspart  0-20 Units Subcutaneous TID WC  . insulin aspart  0-5 Units Subcutaneous QHS  . insulin aspart  6 Units Subcutaneous TID WC  . insulin detemir  15 Units Subcutaneous BID  . methylPREDNISolone (SOLU-MEDROL) injection  60 mg Intravenous Q12H   Continuous  Infusions: . sodium chloride Stopped (08/29/20 0000)  . remdesivir 100 mg in NS 100 mL 100 mg (08/31/20 1140)   PRN Meds:.sodium chloride, acetaminophen, chlorpheniramine-HYDROcodone, guaiFENesin-dextromethorphan, ondansetron **OR** ondansetron (ZOFRAN) IV   Time Spent in minutes  25  See all Orders from today for further details   Jeoffrey MassedShanker Chrystina Naff M.D on 08/31/2020 at 2:00 PM  To page go  to [email or web address] - use universal password  [facility] -  Office  [phone]    Objective:   Vitals:   08/31/20 0000 08/31/20 0400 08/31/20 0825 08/31/20 1156  BP: 132/63 134/67 140/82 140/76  Pulse: 72 71 85 84  Resp: 20 (!) 22 (!) 21 20  Temp: 98.5 F (36.9 C) 97.9 F (36.6 C) 98.3 F (36.8 C) 98 F (36.7 C)  TempSrc: Oral Oral Oral Oral  SpO2: 92% (!) 87% 93% 90%  Weight:      Height:        Wt Readings from Last 3 Encounters:  08/30/20 133.8 kg  01/26/17 (!) 142.4 kg  10/16/16 (!) 147.1 kg     Intake/Output Summary (Last 24 hours) at 08/31/2020 1400 Last data filed at 08/31/2020 1320 Gross per 24 hour  Intake 600 ml  Output 1900 ml  Net -1300 ml     Physical Exam Gen Exam:Alert awake-not in any distress HEENT:atraumatic, normocephalic Chest: B/L clear to auscultation anteriorly CVS:S1S2 regular Abdomen:soft non tender, non distended Extremities:no edema Neurology: Non focal Skin: no rash   Data Review:    CBC Recent Labs  Lab 08/28/20 1355 08/29/20 0938 08/30/20 0328 08/31/20 0315  WBC 10.1 9.5 5.9 10.5  HGB 15.7 14.5 15.4 15.1  HCT 46.4 44.6 46.3 45.4  PLT 199 174 207 270  MCV 79.5* 80.5 81.1 81.2  MCH 26.9 26.2 27.0 27.0  MCHC 33.8 32.5 33.3 33.3  RDW 12.7 12.7 12.7 12.6  LYMPHSABS 1.0 1.3 0.6* 0.3*  MONOABS 0.6 0.5 0.2 0.6  EOSABS 0.0 0.0 0.0 0.0  BASOSABS 0.0 0.0 0.0 0.0    Chemistries  Recent Labs  Lab 08/28/20 1355 08/29/20 0938 08/30/20 0328 08/31/20 0315  NA 129* 135 135 134*  K 3.7 3.7 3.7 3.8  CL 93* 98 97* 98  CO2 23 27 27 25   GLUCOSE 295* 274* 303* 289*  BUN 13 11 13 18   CREATININE 0.75 0.75 0.79 0.70  CALCIUM 8.3* 8.7* 8.7* 8.9  AST 43* 36 30 22  ALT 35 32 28 23  ALKPHOS 49 47 49 44  BILITOT 0.9 0.8 0.8 0.5   ------------------------------------------------------------------------------------------------------------------ No results for input(s): CHOL, HDL, LDLCALC, TRIG, CHOLHDL, LDLDIRECT in the last  72 hours.  Lab Results  Component Value Date   HGBA1C 10.8 (H) 08/29/2020   ------------------------------------------------------------------------------------------------------------------ No results for input(s): TSH, T4TOTAL, T3FREE, THYROIDAB in the last 72 hours.  Invalid input(s): FREET3 ------------------------------------------------------------------------------------------------------------------ No results for input(s): VITAMINB12, FOLATE, FERRITIN, TIBC, IRON, RETICCTPCT in the last 72 hours.  Coagulation profile No results for input(s): INR, PROTIME in the last 168 hours.  Recent Labs    08/30/20 0328 08/31/20 0315  DDIMER 0.84* 1.18*    Cardiac Enzymes No results for input(s): CKMB, TROPONINI, MYOGLOBIN in the last 168 hours.  Invalid input(s): CK ------------------------------------------------------------------------------------------------------------------ No results found for: BNP  Micro Results Recent Results (from the past 240 hour(s))  Blood Culture (routine x 2)     Status: None (Preliminary result)   Collection Time: 08/28/20  1:50 PM   Specimen: Right Antecubital; Blood  Result Value Ref Range Status  Specimen Description   Final    RIGHT ANTECUBITAL Performed at [facility], [address] [place], [place] [address]    Special Requests   Final    BOTTLES DRAWN AEROBIC AND ANAEROBIC Blood Culture results may not be optimal due to an inadequate volume of blood received in culture bottles Performed at [facility], [address] [place], [place] [address]    Culture   Final    NO GROWTH 3 DAYS Performed at [facility], [address], [place], [place] [address]    Report Status PENDING  Incomplete  Blood Culture (routine x 2)     Status: None (Preliminary result)   Collection Time: 08/28/20  2:20 PM   Specimen: BLOOD RIGHT HAND  Result Value Ref Range Status   Specimen Description   Final    BLOOD RIGHT  HAND Performed at [facility], [address] [place], [place] [address]    Special Requests   Final    BOTTLES DRAWN AEROBIC AND ANAEROBIC Blood Culture results may not be optimal due to an inadequate volume of blood received in culture bottles Performed at [facility], [address] [place], [place] [address]    Culture   Final    NO GROWTH 3 DAYS Performed at [facility], [address] [place], [place] [address]    Report Status PENDING  Incomplete    Radiology Reports DG Chest 2 View  Result Date: 08/28/2020 CLINICAL DATA:  Short of breath. Diagnosed with COVID 19 infection 1 week ago. EXAM: CHEST - 2 VIEW COMPARISON:  08/16/2019 FINDINGS: There hazy bilateral airspace lung opacities consistent with multifocal pneumonia. No pulmonary edema. No convincing pleural effusion or pneumothorax. Heart, mediastinum and hila are unremarkable. Skeletal structures are grossly intact. IMPRESSION: 1. Hazy bilateral airspace lung opacities consistent with multifocal pneumonia, pattern compatible with COVID-19 infection. Electronically Signed   By: Amie Portland M.D.   On: 08/28/2020 13:23

## 2020-08-31 NOTE — Progress Notes (Addendum)
Inpatient Diabetes Program Recommendations  AACE/ADA: New Consensus Statement on Inpatient Glycemic Control (2015)  Target Ranges:  Prepandial:   less than 140 mg/dL      Peak postprandial:   less than 180 mg/dL (1-2 hours)      Critically ill patients:  140 - 180 mg/dL   Lab Results  Component Value Date   GLUCAP 312 (H) 08/31/2020   HGBA1C 10.8 (H) 08/29/2020    Review of Glycemic Control  Diabetes history: DM 2/new diabetes was never told A1c in 2017 Outpatient Diabetes medications:  Current orders for Inpatient glycemic control:  Levemir 15 units bid Novolog 0-20 units tid + hs Novolog 6 units tid meal coverage  Solumedrol 60 mg Q12 hours A1c 10.8% on 9/5 8.9 on 05/15/2016  Inpatient Diabetes Program Recommendations:    -  Consider Levemir 25 units bid  -  Increase Novolog meal coverage to 10 units tid  Called pt over phone to discuss A1c level and glucose control at home. Noted no meds listed on home med rec.  A1c back in 2017 never discussed with pt. Pt never diagnosed with DM in the past. Discussed current A1c level and explained what an A1C is. Discussed basic pathophysiology of DM Type 2, basic home care, importance of checking CBGs and maintaining good CBG control to prevent long-term and short-term complications. Reviewed glucose and A1C goals.  Reviewed signs and symptoms of hyperglycemia and hypoglycemia along with treatment for both. Discussed impact of nutrition, exercise, stress, sickness, and medications on diabetes control. Reviewed Living Well with diabetes booklet and encouraged patient to read through entire book. Informed patient that he will be prescribed long acting insulin not sure if it will be 1-2 times a day.   Discussed basal insulin in detail (how to take it, when to take it). Asked patient to check his glucose 3 times per day (before meals) and to keep a log book of glucose readings and insulin taken. Pt sees a Designer, jewellery at [facility] that  he will follow up with. Explained how the doctor he follows up with can use the log book to continue to make insulin adjustments if needed. Informed patient that RN will be asking him to self-administer insulin to ensure proper technique and ability to administer self insulin shots. Patient verbalized understanding of information discussed and he states that he has no further questions at this time related to diabetes.   RNs to provide ongoing basic DM education at bedside with this patient and engage patient to actively check blood glucose and administer insulin injections.    Lantus solostar insulin pen (given copay card via email) order # [phone] Insulin pen needles order # [id number] Glucose meter kit order # [id number]  Will call again mid morning-lunchtime tomorrow 9/8. Sending DM Educational videos, Lantus copay card link via personal email.  Thanks,  Tama Headings RN, MSN, BC-ADM Inpatient Diabetes Coordinator Team Pager [phone] (8a-5p)

## 2020-08-31 NOTE — Progress Notes (Signed)
ANTICOAGULATION CONSULT NOTE - Initial Consult  Pharmacy Consult for Enoxaparin Indication: DVT  No Known Allergies  Patient Measurements: Height: 5\' 6"  (167.6 cm) Weight: 133.8 kg (295 lb) IBW/kg (Calculated) : 63.8   Vital Signs: Temp: 98 F (36.7 C) (09/07 1156) Temp Source: Oral (09/07 1156) BP: 140/76 (09/07 1156) Pulse Rate: 84 (09/07 1156)  Labs: Recent Labs    08/29/20 0938 08/29/20 0938 08/30/20 0328 08/31/20 0315  HGB 14.5   < > 15.4 15.1  HCT 44.6  --  46.3 45.4  PLT 174  --  207 270  CREATININE 0.75  --  0.79 0.70   < > = values in this interval not displayed.    Estimated Creatinine Clearance: 143.4 mL/min (by C-G formula based on SCr of 0.7 mg/dL).   Medical History: Past Medical History:  Diagnosis Date  . Chronic back pain   . Elevated blood pressure reading     Assessment: 49 yo male with COVID + diagnosis now with findings consistent with acute deep vein thrombosis involving the left gastrocnemius veins. Patient has been receiving 65mg  SQ q24h for VTE prophylaxis. Will increase this to 1mg /kg SQ q12.   Goal of Therapy:  Anti-Xa level 0.6-1 units/ml 4hrs after LMWH dose given Monitor platelets by anticoagulation protocol: Yes   Plan:  Enoxaparin 70 mg SQ x 1 now Then 135mg  SQ q12h thereafter Monitor for bleeding and for renal function  Yahmir Sokolov A. 49, PharmD, BCPS, FNKF Clinical Pharmacist [place] Please utilize Amion for appropriate phone number to reach the unit pharmacist [facility])

## 2020-09-01 DIAGNOSIS — I10 Essential (primary) hypertension: Secondary | ICD-10-CM

## 2020-09-01 LAB — CBC WITH DIFFERENTIAL/PLATELET
Abs Immature Granulocytes: 0.2 10*3/uL — ABNORMAL HIGH (ref 0.00–0.07)
Basophils Absolute: 0.1 10*3/uL (ref 0.0–0.1)
Basophils Relative: 1 %
Eosinophils Absolute: 0 10*3/uL (ref 0.0–0.5)
Eosinophils Relative: 0 %
HCT: 46.8 % (ref 39.0–52.0)
Hemoglobin: 15.2 g/dL (ref 13.0–17.0)
Immature Granulocytes: 2 %
Lymphocytes Relative: 6 %
Lymphs Abs: 0.9 10*3/uL (ref 0.7–4.0)
MCH: 26.8 pg (ref 26.0–34.0)
MCHC: 32.5 g/dL (ref 30.0–36.0)
MCV: 82.4 fL (ref 80.0–100.0)
Monocytes Absolute: 0.7 10*3/uL (ref 0.1–1.0)
Monocytes Relative: 5 %
Neutro Abs: 11.7 10*3/uL — ABNORMAL HIGH (ref 1.7–7.7)
Neutrophils Relative %: 86 %
Platelets: 180 10*3/uL (ref 150–400)
RBC: 5.68 MIL/uL (ref 4.22–5.81)
RDW: 12.4 % (ref 11.5–15.5)
WBC: 13.6 10*3/uL — ABNORMAL HIGH (ref 4.0–10.5)
nRBC: 0 % (ref 0.0–0.2)

## 2020-09-01 LAB — COMPREHENSIVE METABOLIC PANEL
ALT: 22 U/L (ref 0–44)
AST: 24 U/L (ref 15–41)
Albumin: 2.6 g/dL — ABNORMAL LOW (ref 3.5–5.0)
Alkaline Phosphatase: 48 U/L (ref 38–126)
Anion gap: 12 (ref 5–15)
BUN: 16 mg/dL (ref 6–20)
CO2: 28 mmol/L (ref 22–32)
Calcium: 8.7 mg/dL — ABNORMAL LOW (ref 8.9–10.3)
Chloride: 96 mmol/L — ABNORMAL LOW (ref 98–111)
Creatinine, Ser: 0.85 mg/dL (ref 0.61–1.24)
GFR calc Af Amer: >60 mL/min (ref >60)
GFR calc non Af Amer: >60 mL/min (ref >60)
Glucose, Bld: 280 mg/dL — ABNORMAL HIGH (ref 70–99)
Potassium: 4 mmol/L (ref 3.5–5.1)
Sodium: 136 mmol/L (ref 135–145)
Total Bilirubin: 0.6 mg/dL (ref 0.3–1.2)
Total Protein: 6.4 g/dL — ABNORMAL LOW (ref 6.5–8.1)

## 2020-09-01 LAB — GLUCOSE, CAPILLARY
Glucose-Capillary: 261 mg/dL — ABNORMAL HIGH (ref 70–99)
Glucose-Capillary: 236 mg/dL — ABNORMAL HIGH (ref 70–99)
Glucose-Capillary: 354 mg/dL — ABNORMAL HIGH (ref 70–99)

## 2020-09-01 LAB — D-DIMER, QUANTITATIVE: D-Dimer, Quant: 0.7 ug/mL-FEU — ABNORMAL HIGH (ref 0.00–0.50)

## 2020-09-01 LAB — C-REACTIVE PROTEIN: CRP: 2.5 mg/dL — ABNORMAL HIGH (ref <1.0)

## 2020-09-01 MED ORDER — INSULIN ASPART 100 UNIT/ML FLEXPEN: PEN_INJECTOR | SUBCUTANEOUS | 0 refills | Status: DC

## 2020-09-01 MED ORDER — METFORMIN HCL 500 MG PO TABS: 500.0000 mg | ORAL_TABLET | Freq: Two times a day (BID) | ORAL | 0 refills | Status: DC

## 2020-09-01 MED ORDER — BENZONATATE 100 MG PO CAPS: 100.0000 mg | ORAL_CAPSULE | Freq: Four times a day (QID) | ORAL | 0 refills | Status: DC | PRN

## 2020-09-01 MED ORDER — PREDNISONE 10 MG PO TABS: ORAL_TABLET | ORAL | 0 refills | Status: DC

## 2020-09-01 MED ORDER — INSULIN PEN NEEDLE 32G X 4 MM MISC: 1.0000 | 0 refills | Status: DC | PRN

## 2020-09-01 MED ORDER — BLOOD GLUCOSE METER KIT: PACK | 0 refills | Status: DC

## 2020-09-01 MED ORDER — ALBUTEROL SULFATE HFA 108 (90 BASE) MCG/ACT IN AERS: 2.0000 | INHALATION_SPRAY | RESPIRATORY_TRACT | 0 refills | Status: DC | PRN

## 2020-09-01 MED ORDER — AMLODIPINE BESYLATE 5 MG PO TABS
5.0000 mg | ORAL_TABLET | Freq: Every day | ORAL | 0 refills | Status: DC
Start: 2020-09-02 — End: 2020-10-05

## 2020-09-01 MED ORDER — INSULIN DETEMIR 100 UNIT/ML FLEXPEN: 30.0000 [IU] | PEN_INJECTOR | Freq: Every day | SUBCUTANEOUS | 0 refills | Status: DC

## 2020-09-01 MED ORDER — RIVAROXABAN 20 MG PO TABS: 20.0000 mg | ORAL_TABLET | Freq: Every day | ORAL | Status: DC

## 2020-09-01 MED ORDER — PREDNISONE 20 MG PO TABS
40.0000 mg | ORAL_TABLET | Freq: Every day | ORAL | Status: DC
  Administered 2020-09-01: 40 mg via ORAL
  Filled 2020-09-01: qty 2

## 2020-09-01 MED ORDER — ORAL CARE MOUTH RINSE
15.0000 mL | Freq: Two times a day (BID) | OROMUCOSAL | Status: DC
  Administered 2020-09-01: 15 mL via OROMUCOSAL

## 2020-09-01 MED ORDER — RIVAROXABAN 15 MG PO TABS: 15.0000 mg | ORAL_TABLET | Freq: Two times a day (BID) | ORAL | Status: DC

## 2020-09-01 MED ORDER — RIVAROXABAN (XARELTO) VTE STARTER PACK (15 & 20 MG): ORAL_TABLET | ORAL | 0 refills | Status: DC

## 2020-09-01 MED FILL — ACCU-CHEK GUIDE W/DEVICE KI: W/DEVICE | 1 days supply | Qty: 1 | Fill #0

## 2020-09-01 MED FILL — AMLODIPINE BESYLATE 5 MG TA: 5 | 30 days supply | Qty: 30 | Fill #0

## 2020-09-01 MED FILL — XARELTO STARTER PACK: 15 & 20 | 30 days supply | Qty: 51 | Fill #0

## 2020-09-01 MED FILL — ACCU-CHEK GUIDE TEST STRIP: 25 days supply | Qty: 100 | Fill #0

## 2020-09-01 MED FILL — BENZONATATE 100 MG CAP: 100 | 7 days supply | Qty: 30 | Fill #0

## 2020-09-01 MED FILL — PENTIPS 32G X 4 MM MISC: 32G X 4 MM | 30 days supply | Qty: 200 | Fill #0

## 2020-09-01 MED FILL — ACCU-CHEK SOFTCLIX LANCETS: 25 days supply | Qty: 100 | Fill #0

## 2020-09-01 MED FILL — predniSONE 10 MG TABS: 10 | 4 days supply | Qty: 10 | Fill #0

## 2020-09-01 MED FILL — ALBUTEROL SULFATE HFA 108 (: 108 (90 BAS | 16 days supply | Qty: 18 | Fill #0

## 2020-09-01 MED FILL — LEVEMIR FLEXTOUCH 100 UNITS: 100 | 30 days supply | Qty: 9 | Fill #0

## 2020-09-01 MED FILL — HUMALOG 100 UNITS/ML KWIKPE: 100 | 25 days supply | Qty: 15 | Fill #0

## 2020-09-01 MED FILL — metFORMIN HCL 500 MG TABS: 500 | 30 days supply | Qty: 60 | Fill #0

## 2020-09-01 NOTE — Progress Notes (Signed)
Pt given discharge instructions, prescriptions, and care notes. Pt given Diabetes Insurance underwriter. TOC Rx meds delivered to bedside. Home O2 tanks delivered to bedside, Pt instructed how to use. Pt verbalized understanding AEB no further questions or concerns at this time. IV was discontinued, no redness, pain, or swelling noted at this time. Telemetry discontinued and Centralized Telemetry was notified. Pt left the floor via wheelchair with staff in stable condition.

## 2020-09-01 NOTE — Progress Notes (Signed)
SATURATION QUALIFICATIONS: (This note is used to comply with regulatory documentation for home oxygen)  Patient Saturations on Room Air at Rest = 86%  Patient Saturations on 4 Liters of oxygen while Ambulating = 90%  Please briefly explain why patient needs home oxygen: Pt needs home O2 in order to maintain O2 sats above 88% while resting & ambulation.

## 2020-09-01 NOTE — Progress Notes (Signed)
ANTICOAGULATION CONSULT NOTE - Initial Consult  Pharmacy Consult for Enoxaparin>>Rivaroxaban Indication: DVT  No Known Allergies  Patient Measurements: Height: 5\' 6"  (167.6 cm) Weight: 133.8 kg (295 lb) IBW/kg (Calculated) : 63.8   Vital Signs: Temp: 99.4 F (37.4 C) (09/08 0800) Temp Source: Oral (09/08 0800) BP: 148/94 (09/08 0800) Pulse Rate: 87 (09/08 0800)  Labs: Recent Labs    08/30/20 0328 08/30/20 0328 08/31/20 0315 09/01/20 0522  HGB 15.4   < > 15.1 15.2  HCT 46.3  --  45.4 46.8  PLT 207  --  270 180  CREATININE 0.79  --  0.70 0.85   < > = values in this interval not displayed.    Estimated Creatinine Clearance: 135 mL/min (by C-G formula based on SCr of 0.85 mg/dL).   Medical History: Past Medical History:  Diagnosis Date  . Chronic back pain   . Elevated blood pressure reading     Assessment: 49 yo male with COVID + diagnosis now with findings consistent with acute deep vein thrombosis involving the left gastrocnemius veins. Patient has been receiving enoxaparin therapeutic dosing. Last dose received this AM. MD wishes to change to rivaroxaban PO tonight.   Goal of Therapy:  Anti-Xa level 0.6-1 units/ml 4hrs after LMWH dose given Monitor platelets by anticoagulation protocol: Yes   Plan:  D/c enoxaparin Start rivaroxaban 15mg  BID tonight at 2200 for 21 days, then Rivaroxaban 20mg  daily thereafter.  Monitor for bleeding and for renal function  Saif Peter A. 42, PharmD, BCPS, FNKF Clinical Pharmacist [place] Please utilize Amion for appropriate phone number to reach the unit pharmacist [facility])

## 2020-09-01 NOTE — TOC Progression Note (Signed)
Transition of Care [facility]) - Progression Note    Patient Details  Name: Eric Rios MRN: [id number] Date of Birth: 1971/07/29  [facility] CM/SW Contact  Lockie Pares, RN Phone Number: 09/01/2020, 11:47 AM  Clinical Narrative:    Patient given options for o2 company over the phone. He states he would like to go with whomever his insurance will cover. [employer] will provide his oxygen.    Expected Discharge Plan: Home/Self Care Barriers to Discharge: Continued Medical Work up  Expected Discharge Plan and Services Expected Discharge Plan: Home/Self Care   Discharge Planning Services: CM Consult   Living arrangements for the past 2 months: Single Family Home Expected Discharge Date: 09/01/20               DME Arranged: Oxygen                     Social Determinants of Health (SDOH) Interventions    Readmission Risk Interventions No flowsheet data found.

## 2020-09-01 NOTE — Discharge Summary (Signed)
PATIENT DETAILS Name: Eric Rios Age: 49 y.o. Sex: male Date of Birth: 25-May-1971 MRN: [id number]. Admitting Physician: Etta Quill, DO BTD:VVOHY, Leticia Penna, NP  Admit Date: 08/28/2020 Discharge date: 09/01/2020  Recommendations for Outpatient Follow-up:  1. Follow up with PCP in 1-2 weeks 2. Please obtain CMP/CBC in one week 3. Repeat Chest Xray in 4-6 week 4. Please reassess at next visit whether patient still requires home O2 5. Noncompliant with CPAP-please continue counseling 6.   Admitted From:  Home  Disposition: [place]: /No  Equipment/Devices: Oxygen 2 L at rest-and 4 L with ambulation and sleeping.    Discharge Condition: Stable  CODE STATUS: FULL CODE  Diet recommendation:  Diet Order            Diet - low sodium heart healthy           Diet Carb Modified           Diet Carb Modified Fluid consistency: Thin; Room service appropriate? Yes  Diet effective now                  Brief Narrative: Patient is a 49 y.o. male with PMHx morbid obesity, OSA (on CPAP at home)-diagnosed with COVID-19 on 8/28 presented to the hospital with worsening shortness of breath-found to have acute hypoxic respiratory failure secondary to COVID-19 pneumonia.  COVID-19 vaccinated status: Unvaccinated  Significant Events: 9/4>> Admit to [facility] for hypoxia due to COVID-19 pneumonia  Significant studies: 9/4>>Chest x-ray: Hazy bilateral airspace lung opacities consistent with multifocal pneumonia 9/7>>B/L Lower ext doppler: acute deep vein thrombosis involving the left gastrocnemius veins   COVID-19 medications: Steroids: 9/4>> Remdesivir: 9/4>>9/8  Antibiotics: None  Microbiology data: 9/4 >>blood culture: No growth  Procedures: None  Consults: None  Brief Hospital Course: Acute Hypoxic Resp Failure due to Covid 19 Viral pneumonia:  Improved-had mild hypoxemia-required around 2-3 L of oxygen on initial presentation-at rest he  probably does not require O2-but does require O2 with ambulation.  He also probably requires O2 with sleep-he is noncompliant with CPAP-has not apparently use it for more than a year.  Since clinically improved-stable for discharge today on tapering steroids.  He was treated with a course of remdesivir.  I have asked him to follow-up with his primary care practitioner post discharge-he will require a repeat two-view x-ray in 4 to 6 weeks, and will need reassessment by his primary care practitioner at his next visit to see if he still requires home O2.  COVID-19 Labs:  Recent Labs    08/30/20 0328 08/31/20 0315 09/01/20 0522 09/01/20 0652  DDIMER 0.84* 1.18*  --  0.70*  CRP 14.6* 6.8* 2.5*  --     No results found for: SARSCOV2NAA   DM-2 (A1c 10.8) with steroid-induced hyperglycemia: appears to be a new diagnoses to the patient although he already had a significantly elevated A1c in 2017.He was treated with Levemir and NovoLog insulin-on discharge-we will transition him to daily dosing of Levemir and sliding scale insulin along with Metformin.  Suspect he will require less insulin on discharge as he will be on significantly less doses of steroids.  He was provided diabetic education-he is aware of the symptoms of hypoglycemia and the needed interventions to be done.  He has watched numerous videos while in the hospital regarding diabetes etc.  He seems confident enough to able to manage CBGs monitoring and insulin therapy at home.  He will need follow-up with his primary care practitioner  for further continued care.  HTN: BP stable-continue Amlodipine on discharge-follow with PCP for further optimization.   Left lower extremity DVT:  Provoked by COVID-19 infection seen on Doppler 9/7-treated with Lovenox-we will switch to Xarelto on discharge.  Probably will require at least 3 months of therapy with anticoagulation.  OSA: On CPAP at home which he has not used for more than a year-due to  intolerance. Tried using CPAP during this hospitalization-but intolerant.  Have asked patient to follow-up with his PCP for further issues.  In the meantime-while he was in the hospital-we put him on 4 L of oxygen at night which he seemed to tolerate well-and his O2 sats were relatively stable.  Have asked patient to continue using 4 L of oxygen when he sleeps till his CPAP can be arranged.  Morbid Obesity: Estimated body mass index is 47.61 kg/m as calculated from the following:   Height as of this encounter: $RemoveBeforeD'5\' 6"'YYdJlnWyqKnviE$  (1.676 m).   Weight as of this encounter: 133.8 kg.   Discharge Diagnoses:  Principal Problem:   Acute hypoxemic respiratory failure due to COVID-19 [facility]) Active Problems:   Morbid obesity with BMI of 50.0-59.9, adult ([place])   Pneumonia due to COVID-19 virus   Diabetes mellitus due to underlying condition [facility])   Discharge Instructions:    Person Under Monitoring Name: Eric Rios  Location: [address] [place] [address]   Infection Prevention Recommendations for Individuals Confirmed to have, or Being Evaluated for, 2019 Novel Coronavirus (COVID-19) Infection Who Receive Care at Home  Individuals who are confirmed to have, or are being evaluated for, COVID-19 should follow the prevention steps below until a healthcare provider or local or state health department says they can return to normal activities.  Stay home except to get medical care You should restrict activities outside your home, except for getting medical care. Do not go to work, school, or public areas, and do not use public transportation or taxis.  Call ahead before visiting your doctor Before your medical appointment, call the healthcare provider and tell them that you have, or are being evaluated for, COVID-19 infection. This will help the healthcare provider's office take steps to keep other people from getting infected. Ask your healthcare provider to call the local or  state health department.  Monitor your symptoms Seek prompt medical attention if your illness is worsening (e.g., difficulty breathing). Before going to your medical appointment, call the healthcare provider and tell them that you have, or are being evaluated for, COVID-19 infection. Ask your healthcare provider to call the local or state health department.  Wear a facemask You should wear a facemask that covers your nose and mouth when you are in the same room with other people and when you visit a healthcare provider. People who live with or visit you should also wear a facemask while they are in the same room with you.  Separate yourself from other people in your home As much as possible, you should stay in a different room from other people in your home. Also, you should use a separate bathroom, if available.  Avoid sharing household items You should not share dishes, drinking glasses, cups, eating utensils, towels, bedding, or other items with other people in your home. After using these items, you should wash them thoroughly with soap and water.  Cover your coughs and sneezes Cover your mouth and nose with a tissue when you cough or sneeze, or you can cough or sneeze into your  sleeve. Throw used tissues in a lined trash can, and immediately wash your hands with soap and water for at least 20 seconds or use an alcohol-based hand rub.  Wash your [facility] your hands often and thoroughly with soap and water for at least 20 seconds. You can use an alcohol-based hand sanitizer if soap and water are not available and if your hands are not visibly dirty. Avoid touching your eyes, nose, and mouth with unwashed hands.   Prevention Steps for Caregivers and Household Members of Individuals Confirmed to have, or Being Evaluated for, COVID-19 Infection Being Cared for in the Home  If you live with, or provide care at home for, a person confirmed to have, or being evaluated for, COVID-19  infection please follow these guidelines to prevent infection:  Follow healthcare provider's instructions Make sure that you understand and can help the patient follow any healthcare provider instructions for all care.  Provide for the patient's basic needs You should help the patient with basic needs in the home and provide support for getting groceries, prescriptions, and other personal needs.  Monitor the patient's symptoms If they are getting sicker, call his or her medical provider and tell them that the patient has, or is being evaluated for, COVID-19 infection. This will help the healthcare provider's office take steps to keep other people from getting infected. Ask the healthcare provider to call the local or state health department.  Limit the number of people who have contact with the patient  If possible, have only one caregiver for the patient.  Other household members should stay in another home or place of residence. If this is not possible, they should stay  in another room, or be separated from the patient as much as possible. Use a separate bathroom, if available.  Restrict visitors who do not have an essential need to be in the home.  Keep older adults, very young children, and other sick people away from the patient Keep older adults, very young children, and those who have compromised immune systems or chronic health conditions away from the patient. This includes people with chronic heart, lung, or kidney conditions, diabetes, and cancer.  Ensure good ventilation Make sure that shared spaces in the home have good air flow, such as from an air conditioner or an opened window, weather permitting.  Wash your hands often  Wash your hands often and thoroughly with soap and water for at least 20 seconds. You can use an alcohol based hand sanitizer if soap and water are not available and if your hands are not visibly dirty.  Avoid touching your eyes, nose, and mouth  with unwashed hands.  Use disposable paper towels to dry your hands. If not available, use dedicated cloth towels and replace them when they become wet.  Wear a facemask and gloves  Wear a disposable facemask at all times in the room and gloves when you touch or have contact with the patient's blood, body fluids, and/or secretions or excretions, such as sweat, saliva, sputum, nasal mucus, vomit, urine, or feces.  Ensure the mask fits over your nose and mouth tightly, and do not touch it during use.  Throw out disposable facemasks and gloves after using them. Do not reuse.  Wash your hands immediately after removing your facemask and gloves.  If your personal clothing becomes contaminated, carefully remove clothing and launder. Wash your hands after handling contaminated clothing.  Place all used disposable facemasks, gloves, and other waste in a lined  container before disposing them with other household waste.  Remove gloves and wash your hands immediately after handling these items.  Do not share dishes, glasses, or other household items with the patient  Avoid sharing household items. You should not share dishes, drinking glasses, cups, eating utensils, towels, bedding, or other items with a patient who is confirmed to have, or being evaluated for, COVID-19 infection.  After the person uses these items, you should wash them thoroughly with soap and water.  Wash laundry thoroughly  Immediately remove and wash clothes or bedding that have blood, body fluids, and/or secretions or excretions, such as sweat, saliva, sputum, nasal mucus, vomit, urine, or feces, on them.  Wear gloves when handling laundry from the patient.  Read and follow directions on labels of laundry or clothing items and detergent. In general, wash and dry with the warmest temperatures recommended on the label.  Clean all areas the individual has used often  Clean all touchable surfaces, such as counters, tabletops,  doorknobs, bathroom fixtures, toilets, phones, keyboards, tablets, and bedside tables, every day. Also, clean any surfaces that may have blood, body fluids, and/or secretions or excretions on them.  Wear gloves when cleaning surfaces the patient has come in contact with.  Use a diluted bleach solution (e.g., dilute bleach with 1 part bleach and 10 parts water) or a household disinfectant with a label that says EPA-registered for coronaviruses. To make a bleach solution at home, add 1 tablespoon of bleach to 1 quart (4 cups) of water. For a larger supply, add  cup of bleach to 1 gallon (16 cups) of water.  Read labels of cleaning products and follow recommendations provided on product labels. Labels contain instructions for safe and effective use of the cleaning product including precautions you should take when applying the product, such as wearing gloves or eye protection and making sure you have good ventilation during use of the product.  Remove gloves and wash hands immediately after cleaning.  Monitor yourself for signs and symptoms of illness Caregivers and household members are considered close contacts, should monitor their health, and will be asked to limit movement outside of the home to the extent possible. Follow the monitoring steps for close contacts listed on the symptom monitoring form.   ? If you have additional questions, contact your local health department or call the epidemiologist on call at [phone] (available 24/7). ? This guidance is subject to change. For the most up-to-date guidance from CDC, please refer to their website: YouBlogs.pl    Activity:  As tolerated  Discharge Instructions    Call MD for:  difficulty breathing, headache or visual disturbances   Complete by: As directed    Call MD for:  extreme fatigue   Complete by: As directed    Call MD for:  persistant dizziness or  light-headedness   Complete by: As directed    Diet - low sodium heart healthy   Complete by: As directed    Diet Carb Modified   Complete by: As directed    Discharge instructions   Complete by: As directed    Follow with Primary MD  Pleas Koch, NP in 1-2 weeks  3 weeks of isolation from 8/28  Please ask your primary care practitioner to repeat two-view chest x-ray in 4 to 6 weeks  Please ask your primary care practitioner to reassess whether you still require home O2 at next visit  You are being placed on blood thinner called Xarelto for your  DVT-please ask your primary care practitioner to refill prescription for Xarelto  Please ask your primary care practitioner to arrange for a CPAP that you can tolerate.  In the meantime-use 4 L of oxygen at night likely you are doing in the hospital.  Use home O2 24/7-2 L at rest-and 4 L with ambulation and with sleep.  Please check your CBGs multiple times a day-keep a reading of these recordings and take it to your appointment with your PCP.        Please get a complete blood count and chemistry panel checked by your Primary MD at your next visit, and again as instructed by your Primary MD.  Get Medicines reviewed and adjusted: Please take all your medications with you for your next visit with your Primary MD  Laboratory/radiological data: Please request your Primary MD to go over all hospital tests and procedure/radiological results at the follow up, please ask your Primary MD to get all Hospital records sent to his/her office.  In some cases, they will be blood work, cultures and biopsy results pending at the time of your discharge. Please request that your primary care M.D. follows up on these results.  Also Note the following: If you experience worsening of your admission symptoms, develop shortness of breath, life threatening emergency, suicidal or homicidal thoughts you must seek medical attention immediately by calling  911 or calling your MD immediately  if symptoms less severe.  You must read complete instructions/literature along with all the possible adverse reactions/side effects for all the Medicines you take and that have been prescribed to you. Take any new Medicines after you have completely understood and accpet all the possible adverse reactions/side effects.   Do not drive when taking Pain medications or sleeping medications (Benzodaizepines)  Do not take more than prescribed Pain, Sleep and Anxiety Medications. It is not advisable to combine anxiety,sleep and pain medications without talking with your primary care practitioner  Special Instructions: If you have smoked or chewed Tobacco  in the last 2 yrs please stop smoking, stop any regular Alcohol  and or any Recreational drug use.  Wear Seat belts while driving.  Please note: You were cared for by a hospitalist during your hospital stay. Once you are discharged, your primary care physician will handle any further medical issues. Please note that NO REFILLS for any discharge medications will be authorized once you are discharged, as it is imperative that you return to your primary care physician (or establish a relationship with a primary care physician if you do not have one) for your post hospital discharge needs so that they can reassess your need for medications and monitor your lab values.   Increase activity slowly   Complete by: As directed      Allergies as of 09/01/2020   No Known Allergies     Medication List    STOP taking these medications   azithromycin 250 MG tablet Commonly known as: ZITHROMAX   HYDROcodone-acetaminophen 5-325 MG tablet Commonly known as: NORCO/VICODIN   ibuprofen 200 MG tablet Commonly known as: ADVIL   methylPREDNISolone 4 MG Tbpk tablet Commonly known as: MEDROL DOSEPAK   TYLENOL COLD/FLU DAY PO     TAKE these medications   albuterol 108 (90 Base) MCG/ACT inhaler Commonly known as: VENTOLIN  HFA Inhale 2 puffs into the lungs every 4 (four) hours as needed for shortness of breath.   amLODipine 5 MG tablet Commonly known as: NORVASC Take 1 tablet (5 mg total) by  mouth daily. Start taking on: September 02, 2020   benzonatate 100 MG capsule Commonly known as: Best boy Take 1 capsule (100 mg total) by mouth every 6 (six) hours as needed for cough.   blood glucose meter kit and supplies Dispense based on patient and insurance preference. Use up to four times daily as directed. (FOR ICD-10 E10.9, E11.9).   insulin aspart 100 UNIT/ML FlexPen Commonly known as: NOVOLOG 1-20 Units, SQ 3 times daily with meals CBG < 70 Implement Hypoglycemia Measures CBG 70 - 120 0 units CBG 121 - 150 3 units CBG 151 - 200 4 units CBG 201 - 250 7 units CBG 251 - 300 11 units CBG 301 - 350 15 units CBG 351 - 400 20 units CBG > 400 call MD   insulin detemir 100 UNIT/ML FlexPen Commonly known as: LEVEMIR Inject 30 Units into the skin at bedtime.   Insulin Pen Needle 32G X 4 MM Misc 1 each by Does not apply route as needed.   metFORMIN 500 MG tablet Commonly known as: Glucophage Take 1 tablet (500 mg total) by mouth 2 (two) times daily with a meal.   predniSONE 10 MG tablet Commonly known as: DELTASONE Take 40 mg daily for 1 day, 30 mg daily for 1 day, 20 mg daily for 1 days,10 mg daily for 1 day, then stop   Rivaroxaban Stater Pack (15 mg and 20 mg) Commonly known as: XARELTO STARTER PACK Follow package directions: Take one $Remove'15mg'LayBiQL$  tablet by mouth twice a day. On day 22, switch to one $Remo'20mg'XLSkX$  tablet once a day. Take with food.   zinc gluconate 50 MG tablet Take 50 mg by mouth daily.            Durable Medical Equipment  (From admission, onward)         Start     Ordered   09/01/20 1025  For home use only DME oxygen  Once       Comments: 2 Litres at rest 4 Litres with sleep and ambulation  Question Answer Comment  Length of Need 6 Months   Mode or (Route) Nasal cannula   Liters  per Minute 4   Frequency Continuous (stationary and portable oxygen unit needed)   Oxygen conserving device Yes   Oxygen delivery system Gas      09/01/20 1024          Follow-up Information    Pleas Koch, NP. Schedule an appointment as soon as possible for a visit in 1 week(s).   Specialty: Internal Medicine Contact information: [place] [address] [phone]              No Known Allergies    Other Procedures/Studies: DG Chest 2 View  Result Date: 08/28/2020 CLINICAL DATA:  Short of breath. Diagnosed with COVID 19 infection 1 week ago. EXAM: CHEST - 2 VIEW COMPARISON:  08/16/2019 FINDINGS: There hazy bilateral airspace lung opacities consistent with multifocal pneumonia. No pulmonary edema. No convincing pleural effusion or pneumothorax. Heart, mediastinum and hila are unremarkable. Skeletal structures are grossly intact. IMPRESSION: 1. Hazy bilateral airspace lung opacities consistent with multifocal pneumonia, pattern compatible with COVID-19 infection. Electronically Signed   By: Lajean Manes M.D.   On: 08/28/2020 13:23   VAS [place] LOWER EXTREMITY VENOUS (DVT)  Result Date: 08/31/2020  Lower Venous DVTStudy Indications: Swelling, and Edema. Other Indications: + covid. Performing Technologist: Griffin Basil RCT RDMS  Examination Guidelines: A complete evaluation includes B-mode  imaging, spectral Doppler, color Doppler, and power Doppler as needed of all accessible portions of each vessel. Bilateral testing is considered an integral part of a complete examination. Limited examinations for reoccurring indications may be performed as noted. The reflux portion of the exam is performed with the patient in reverse Trendelenburg.  +---------+---------------+---------+-----------+----------+--------------+ RIGHT    CompressibilityPhasicitySpontaneityPropertiesThrombus Aging +---------+---------------+---------+-----------+----------+--------------+  CFV      Full           Yes      Yes                                 +---------+---------------+---------+-----------+----------+--------------+ SFJ      Full                                                        +---------+---------------+---------+-----------+----------+--------------+ FV Prox  Full                                                        +---------+---------------+---------+-----------+----------+--------------+ FV Mid   Full                                                        +---------+---------------+---------+-----------+----------+--------------+ FV DistalFull                                                        +---------+---------------+---------+-----------+----------+--------------+ PFV      Full                                                        +---------+---------------+---------+-----------+----------+--------------+ POP      Full           Yes      Yes                                 +---------+---------------+---------+-----------+----------+--------------+ PTV      Full                                                        +---------+---------------+---------+-----------+----------+--------------+ PERO     Full                                                        +---------+---------------+---------+-----------+----------+--------------+   +---------+---------------+---------+-----------+----------+--------------+  LEFT     CompressibilityPhasicitySpontaneityPropertiesThrombus Aging +---------+---------------+---------+-----------+----------+--------------+ CFV      Full           Yes      Yes                                 +---------+---------------+---------+-----------+----------+--------------+ SFJ      Full                                                        +---------+---------------+---------+-----------+----------+--------------+ FV Prox  Full                                                         +---------+---------------+---------+-----------+----------+--------------+ FV Mid   Full                                                        +---------+---------------+---------+-----------+----------+--------------+ FV DistalFull                                                        +---------+---------------+---------+-----------+----------+--------------+ PFV      Full                                                        +---------+---------------+---------+-----------+----------+--------------+ POP      Full           Yes      Yes                                 +---------+---------------+---------+-----------+----------+--------------+ PTV      Full                                                        +---------+---------------+---------+-----------+----------+--------------+ PERO     Full                                                        +---------+---------------+---------+-----------+----------+--------------+     Summary: RIGHT: - There is no evidence of deep vein thrombosis in the lower extremity.  - No cystic structure found in the popliteal fossa.  LEFT: - Findings consistent with acute deep vein thrombosis involving the left gastrocnemius veins. - No cystic  structure found in the popliteal fossa.  *See table(s) above for measurements and observations. Electronically signed by Deitra Mayo MD on 08/31/2020 at 4:05:57 PM.    Final      TODAY-DAY OF DISCHARGE:  Subjective:   Eric Rios today has no headache,no chest abdominal pain,no new weakness tingling or numbness, feels much better wants to go home today.   Objective:   Blood pressure (!) 148/94, pulse 87, temperature 99.4 F (37.4 C), temperature source Oral, resp. rate 19, height $RemoveBe'5\' 6"'CmzehqBme$  (1.676 m), weight 133.8 kg, SpO2 90 %.  Intake/Output Summary (Last 24 hours) at 09/01/2020 1134 Last data filed at 09/01/2020 1100 Gross per 24 hour  Intake 840 ml   Output 1300 ml  Net -460 ml   Filed Weights   08/30/20 1100  Weight: 133.8 kg    Exam: Awake Alert, Oriented *3, No new F.N deficits, Normal affect [place].AT,PERRAL Supple Neck,No JVD, No cervical lymphadenopathy appriciated.  Symmetrical Chest wall movement, Good air movement bilaterally, CTAB RRR,No Gallops,Rubs or new Murmurs, No Parasternal Heave +ve B.Sounds, Abd Soft, Non tender, No organomegaly appriciated, No rebound -guarding or rigidity. No Cyanosis, Clubbing or edema, No new Rash or bruise   PERTINENT RADIOLOGIC STUDIES: DG Chest 2 View  Result Date: 08/28/2020 CLINICAL DATA:  Short of breath. Diagnosed with COVID 19 infection 1 week ago. EXAM: CHEST - 2 VIEW COMPARISON:  08/16/2019 FINDINGS: There hazy bilateral airspace lung opacities consistent with multifocal pneumonia. No pulmonary edema. No convincing pleural effusion or pneumothorax. Heart, mediastinum and hila are unremarkable. Skeletal structures are grossly intact. IMPRESSION: 1. Hazy bilateral airspace lung opacities consistent with multifocal pneumonia, pattern compatible with COVID-19 infection. Electronically Signed   By: Lajean Manes M.D.   On: 08/28/2020 13:23   VAS [place] LOWER EXTREMITY VENOUS (DVT)  Result Date: 08/31/2020  Lower Venous DVTStudy Indications: Swelling, and Edema. Other Indications: + covid. Performing Technologist: Griffin Basil RCT RDMS  Examination Guidelines: A complete evaluation includes B-mode imaging, spectral Doppler, color Doppler, and power Doppler as needed of all accessible portions of each vessel. Bilateral testing is considered an integral part of a complete examination. Limited examinations for reoccurring indications may be performed as noted. The reflux portion of the exam is performed with the patient in reverse Trendelenburg.  +---------+---------------+---------+-----------+----------+--------------+ RIGHT    CompressibilityPhasicitySpontaneityPropertiesThrombus Aging  +---------+---------------+---------+-----------+----------+--------------+ CFV      Full           Yes      Yes                                 +---------+---------------+---------+-----------+----------+--------------+ SFJ      Full                                                        +---------+---------------+---------+-----------+----------+--------------+ FV Prox  Full                                                        +---------+---------------+---------+-----------+----------+--------------+ FV Mid   Full                                                        +---------+---------------+---------+-----------+----------+--------------+  FV DistalFull                                                        +---------+---------------+---------+-----------+----------+--------------+ PFV      Full                                                        +---------+---------------+---------+-----------+----------+--------------+ POP      Full           Yes      Yes                                 +---------+---------------+---------+-----------+----------+--------------+ PTV      Full                                                        +---------+---------------+---------+-----------+----------+--------------+ PERO     Full                                                        +---------+---------------+---------+-----------+----------+--------------+   +---------+---------------+---------+-----------+----------+--------------+ LEFT     CompressibilityPhasicitySpontaneityPropertiesThrombus Aging +---------+---------------+---------+-----------+----------+--------------+ CFV      Full           Yes      Yes                                 +---------+---------------+---------+-----------+----------+--------------+ SFJ      Full                                                         +---------+---------------+---------+-----------+----------+--------------+ FV Prox  Full                                                        +---------+---------------+---------+-----------+----------+--------------+ FV Mid   Full                                                        +---------+---------------+---------+-----------+----------+--------------+ FV DistalFull                                                        +---------+---------------+---------+-----------+----------+--------------+  PFV      Full                                                        +---------+---------------+---------+-----------+----------+--------------+ POP      Full           Yes      Yes                                 +---------+---------------+---------+-----------+----------+--------------+ PTV      Full                                                        +---------+---------------+---------+-----------+----------+--------------+ PERO     Full                                                        +---------+---------------+---------+-----------+----------+--------------+     Summary: RIGHT: - There is no evidence of deep vein thrombosis in the lower extremity.  - No cystic structure found in the popliteal fossa.  LEFT: - Findings consistent with acute deep vein thrombosis involving the left gastrocnemius veins. - No cystic structure found in the popliteal fossa.  *See table(s) above for measurements and observations. Electronically signed by Waverly Ferrari MD on 08/31/2020 at 4:05:57 PM.    Final      PERTINENT LAB RESULTS: CBC: Recent Labs    08/31/20 0315 09/01/20 0522  WBC 10.5 13.6*  HGB 15.1 15.2  HCT 45.4 46.8  PLT 270 180   CMET CMP     Component Value Date/Time   NA 136 09/01/2020 0522   K 4.0 09/01/2020 0522   CL 96 (L) 09/01/2020 0522   CO2 28 09/01/2020 0522   GLUCOSE 280 (H) 09/01/2020 0522   BUN 16 09/01/2020 0522    CREATININE 0.85 09/01/2020 0522   CALCIUM 8.7 (L) 09/01/2020 0522   PROT 6.4 (L) 09/01/2020 0522   ALBUMIN 2.6 (L) 09/01/2020 0522   AST 24 09/01/2020 0522   ALT 22 09/01/2020 0522   ALKPHOS 48 09/01/2020 0522   BILITOT 0.6 09/01/2020 0522   GFRNONAA >60 09/01/2020 0522   GFRAA >60 09/01/2020 0522    GFR Estimated Creatinine Clearance: 135 mL/min (by C-G formula based on SCr of 0.85 mg/dL). No results for input(s): LIPASE, AMYLASE in the last 72 hours. No results for input(s): CKTOTAL, CKMB, CKMBINDEX, TROPONINI in the last 72 hours. Invalid input(s): POCBNP Recent Labs    08/31/20 0315 09/01/20 0652  DDIMER 1.18* 0.70*   No results for input(s): HGBA1C in the last 72 hours. No results for input(s): CHOL, HDL, LDLCALC, TRIG, CHOLHDL, LDLDIRECT in the last 72 hours. No results for input(s): TSH, T4TOTAL, T3FREE, THYROIDAB in the last 72 hours.  Invalid input(s): FREET3 No results for input(s): VITAMINB12, FOLATE, FERRITIN, TIBC, IRON, RETICCTPCT in the last 72 hours. Coags: No results for input(s): INR in the last 72 hours.  Invalid input(s): PT  Microbiology: Recent Results (from the past 240 hour(s))  Blood Culture (routine x 2)     Status: None (Preliminary result)   Collection Time: 08/28/20  1:50 PM   Specimen: Right Antecubital; Blood  Result Value Ref Range Status   Specimen Description   Final    RIGHT ANTECUBITAL Performed at [facility], [address] [place], [place] [address]    Special Requests   Final    BOTTLES DRAWN AEROBIC AND ANAEROBIC Blood Culture results may not be optimal due to an inadequate volume of blood received in culture bottles Performed at [facility], [address] [place], [place] [address]    Culture   Final    NO GROWTH 4 DAYS Performed at [facility], [place] [address], [place], [place] [address]    Report Status PENDING  Incomplete  Blood Culture (routine x 2)     Status: None (Preliminary result)     Collection Time: 08/28/20  2:20 PM   Specimen: BLOOD RIGHT HAND  Result Value Ref Range Status   Specimen Description   Final    BLOOD RIGHT HAND Performed at [facility], [address] [place], [place] [address]    Special Requests   Final    BOTTLES DRAWN AEROBIC AND ANAEROBIC Blood Culture results may not be optimal due to an inadequate volume of blood received in culture bottles Performed at [facility], [address] [place], [place] [address]    Culture   Final    NO GROWTH 4 DAYS Performed at [facility], [place] [address] [place], [place] [address]    Report Status PENDING  Incomplete    FURTHER DISCHARGE INSTRUCTIONS:  Get Medicines reviewed and adjusted: Please take all your medications with you for your next visit with your Primary MD  Laboratory/radiological data: Please request your Primary MD to go over all hospital tests and procedure/radiological results at the follow up, please ask your Primary MD to get all Hospital records sent to his/her office.  In some cases, they will be blood work, cultures and biopsy results pending at the time of your discharge. Please request that your primary care M.D. goes through all the records of your hospital data and follows up on these results.  Also Note the following: If you experience worsening of your admission symptoms, develop shortness of breath, life threatening emergency, suicidal or homicidal thoughts you must seek medical attention immediately by calling 911 or calling your MD immediately  if symptoms less severe.  You must read complete instructions/literature along with all the possible adverse reactions/side effects for all the Medicines you take and that have been prescribed to you. Take any new Medicines after you have completely understood and accpet all the possible adverse reactions/side effects.   Do not drive when taking Pain medications or sleeping medications  (Benzodaizepines)  Do not take more than prescribed Pain, Sleep and Anxiety Medications. It is not advisable to combine anxiety,sleep and pain medications without talking with your primary care practitioner  Special Instructions: If you have smoked or chewed Tobacco  in the last 2 yrs please stop smoking, stop any regular Alcohol  and or any Recreational drug use.  Wear Seat belts while driving.  Please note: You were cared for by a hospitalist during your hospital stay. Once you are discharged, your primary care physician will handle any further medical issues. Please note that NO REFILLS  for any discharge medications will be authorized once you are discharged, as it is imperative that you return to your primary care physician (or establish a relationship with a primary care physician if you do not have one) for your post hospital discharge needs so that they can reassess your need for medications and monitor your lab values.  Total Time spent coordinating discharge including counseling, education and face to face time equals 35 minutes.  SignedOren Binet 09/01/2020 11:34 AM

## 2020-09-01 NOTE — Discharge Instructions (Addendum)
Person Under Monitoring Name: Josafat Enrico  Location: [address] [place] [address]   Infection Prevention Recommendations for Individuals Confirmed to have, or Being Evaluated for, 2019 Novel Coronavirus (COVID-19) Infection Who Receive Care at Home  Individuals who are confirmed to have, or are being evaluated for, COVID-19 should follow the prevention steps below until a healthcare provider or local or state health department says they can return to normal activities.  Stay home except to get medical care You should restrict activities outside your home, except for getting medical care. Do not go to work, school, or public areas, and do not use public transportation or taxis.  Call ahead before visiting your doctor Before your medical appointment, call the healthcare provider and tell them that you have, or are being evaluated for, COVID-19 infection. This will help the healthcare provider's office take steps to keep other people from getting infected. Ask your healthcare provider to call the local or state health department.  Monitor your symptoms Seek prompt medical attention if your illness is worsening (e.g., difficulty breathing). Before going to your medical appointment, call the healthcare provider and tell them that you have, or are being evaluated for, COVID-19 infection. Ask your healthcare provider to call the local or state health department.  Wear a facemask You should wear a facemask that covers your nose and mouth when you are in the same room with other people and when you visit a healthcare provider. People who live with or visit you should also wear a facemask while they are in the same room with you.  Separate yourself from other people in your home As much as possible, you should stay in a different room from other people in your home. Also, you should use a separate bathroom, if available.  Avoid sharing household items You should  not share dishes, drinking glasses, cups, eating utensils, towels, bedding, or other items with other people in your home. After using these items, you should wash them thoroughly with soap and water.  Cover your coughs and sneezes Cover your mouth and nose with a tissue when you cough or sneeze, or you can cough or sneeze into your sleeve. Throw used tissues in a lined trash can, and immediately wash your hands with soap and water for at least 20 seconds or use an alcohol-based hand rub.  Wash your [employer] your hands often and thoroughly with soap and water for at least 20 seconds. You can use an alcohol-based hand sanitizer if soap and water are not available and if your hands are not visibly dirty. Avoid touching your eyes, nose, and mouth with unwashed hands.   Prevention Steps for Caregivers and Household Members of Individuals Confirmed to have, or Being Evaluated for, COVID-19 Infection Being Cared for in the Home  If you live with, or provide care at home for, a person confirmed to have, or being evaluated for, COVID-19 infection please follow these guidelines to prevent infection:  Follow healthcare provider's instructions Make sure that you understand and can help the patient follow any healthcare provider instructions for all care.  Provide for the patient's basic needs You should help the patient with basic needs in the home and provide support for getting groceries, prescriptions, and other personal needs.  Monitor the patient's symptoms If they are getting sicker, call his or her medical provider and tell them that the patient has, or is being evaluated for, COVID-19 infection. This will help the healthcare provider's office  take steps to keep other people from getting infected. Ask the healthcare provider to call the local or state health department.  Limit the number of people who have contact with the patient  If possible, have only one caregiver for the  patient.  Other household members should stay in another home or place of residence. If this is not possible, they should stay  in another room, or be separated from the patient as much as possible. Use a separate bathroom, if available.  Restrict visitors who do not have an essential need to be in the home.  Keep older adults, very young children, and other sick people away from the patient Keep older adults, very young children, and those who have compromised immune systems or chronic health conditions away from the patient. This includes people with chronic heart, lung, or kidney conditions, diabetes, and cancer.  Ensure good ventilation Make sure that shared spaces in the home have good air flow, such as from an air conditioner or an opened window, weather permitting.  Wash your hands often  Wash your hands often and thoroughly with soap and water for at least 20 seconds. You can use an alcohol based hand sanitizer if soap and water are not available and if your hands are not visibly dirty.  Avoid touching your eyes, nose, and mouth with unwashed hands.  Use disposable paper towels to dry your hands. If not available, use dedicated cloth towels and replace them when they become wet.  Wear a facemask and gloves  Wear a disposable facemask at all times in the room and gloves when you touch or have contact with the patient's blood, body fluids, and/or secretions or excretions, such as sweat, saliva, sputum, nasal mucus, vomit, urine, or feces.  Ensure the mask fits over your nose and mouth tightly, and do not touch it during use.  Throw out disposable facemasks and gloves after using them. Do not reuse.  Wash your hands immediately after removing your facemask and gloves.  If your personal clothing becomes contaminated, carefully remove clothing and launder. Wash your hands after handling contaminated clothing.  Place all used disposable facemasks, gloves, and other waste in a lined  container before disposing them with other household waste.  Remove gloves and wash your hands immediately after handling these items.  Do not share dishes, glasses, or other household items with the patient  Avoid sharing household items. You should not share dishes, drinking glasses, cups, eating utensils, towels, bedding, or other items with a patient who is confirmed to have, or being evaluated for, COVID-19 infection.  After the person uses these items, you should wash them thoroughly with soap and water.  Wash laundry thoroughly  Immediately remove and wash clothes or bedding that have blood, body fluids, and/or secretions or excretions, such as sweat, saliva, sputum, nasal mucus, vomit, urine, or feces, on them.  Wear gloves when handling laundry from the patient.  Read and follow directions on labels of laundry or clothing items and detergent. In general, wash and dry with the warmest temperatures recommended on the label.  Clean all areas the individual has used often  Clean all touchable surfaces, such as counters, tabletops, doorknobs, bathroom fixtures, toilets, phones, keyboards, tablets, and bedside tables, every day. Also, clean any surfaces that may have blood, body fluids, and/or secretions or excretions on them.  Wear gloves when cleaning surfaces the patient has come in contact with.  Use a diluted bleach solution (e.g., dilute bleach with 1 part  bleach and 10 parts water) or a household disinfectant with a label that says EPA-registered for coronaviruses. To make a bleach solution at home, add 1 tablespoon of bleach to 1 quart (4 cups) of water. For a larger supply, add  cup of bleach to 1 gallon (16 cups) of water.  Read labels of cleaning products and follow recommendations provided on product labels. Labels contain instructions for safe and effective use of the cleaning product including precautions you should take when applying the product, such as wearing gloves or  eye protection and making sure you have good ventilation during use of the product.  Remove gloves and wash hands immediately after cleaning.  Monitor yourself for signs and symptoms of illness Caregivers and household members are considered close contacts, should monitor their health, and will be asked to limit movement outside of the home to the extent possible. Follow the monitoring steps for close contacts listed on the symptom monitoring form.   ? If you have additional questions, contact your local health department or call the epidemiologist on call at [phone] (available 24/7). ? This guidance is subject to change. For the most up-to-date guidance from [facility], please refer to their website: TripMetro.hu  Information on my medicine - XARELTO (rivaroxaban)  This medication education was reviewed with me or my healthcare representative as part of my discharge preparation.   WHY WAS XARELTO PRESCRIBED FOR YOU? Xarelto was prescribed to treat blood clots that may have been found in the veins of your legs (deep vein thrombosis) or in your lungs (pulmonary embolism) and to reduce the risk of them occurring again.  What do you need to know about Xarelto? The starting dose is one 15 mg tablet taken TWICE daily with food for the FIRST 21 DAYS then on (enter date)  09/22/20  the dose is changed to one 20 mg tablet taken ONCE A DAY with your evening meal.  DO NOT stop taking Xarelto without talking to the health care provider who prescribed the medication.  Refill your prescription for 20 mg tablets before you run out.  After discharge, you should have regular check-up appointments with your healthcare provider that is prescribing your Xarelto.  In the future your dose may need to be changed if your kidney function changes by a significant amount.  What do you do if you miss a dose? If you are taking Xarelto TWICE DAILY  and you miss a dose, take it as soon as you remember. You may take two 15 mg tablets (total 30 mg) at the same time then resume your regularly scheduled 15 mg twice daily the next day.  If you are taking Xarelto ONCE DAILY and you miss a dose, take it as soon as you remember on the same day then continue your regularly scheduled once daily regimen the next day. Do not take two doses of Xarelto at the same time.   Important Safety Information Xarelto is a blood thinner medicine that can cause bleeding. You should call your healthcare provider right away if you experience any of the following: ? Bleeding from an injury or your nose that does not stop. ? Unusual colored urine (red or dark brown) or unusual colored stools (red or black). ? Unusual bruising for unknown reasons. ? A serious fall or if you hit your head (even if there is no bleeding).  Some medicines may interact with Xarelto and might increase your risk of bleeding while on Xarelto. To help avoid this, consult your healthcare  provider or pharmacist prior to using any new prescription or non-prescription medications, including herbals, vitamins, non-steroidal anti-inflammatory drugs (NSAIDs) and supplements.  This website has more information on Xarelto: VisitDestination.com.br.

## 2020-09-02 ENCOUNTER — Telehealth: Payer: Self-pay

## 2020-09-02 LAB — CULTURE, BLOOD (ROUTINE X 2)
Culture: NO GROWTH
Culture: NO GROWTH

## 2020-09-02 NOTE — Telephone Encounter (Signed)
Mr Eric Rios wife Eric Rios called in to state she is worried about the oxygen, they called from [employer] stating they would be delivering the oxygen this morning, but her husband has only one tank remaining and she is getting "nervous". I called Oletha Cruel from [employer] who will call his team and call [name] back to reassure her that the oxygen is on the way.

## 2020-09-19 NOTE — Telephone Encounter (Signed)
See notes

## 2020-09-23 ENCOUNTER — Telehealth: Payer: Self-pay

## 2020-09-23 NOTE — Telephone Encounter (Signed)
Was given CM number from patient for his Short term disability worker. [phone]. Gave her the number to HIM [phone] to obtain medical records. Patient has not followed up with primary MD, even though he was told to make an appointment. He understands he is to have a repeat chest xray within a month of his hospital stay.

## 2020-09-23 NOTE — Telephone Encounter (Signed)
Called patient to see about follow up  With Primary care doctor. He is still having lots of trouble breathing Overall feels better, sugars are lower, blood pressures lower. Oxygen level has been lower when walking, but little by little has improved in ambulation, walking the extent of the house. Has not seen primary because he could not walk any distances. Went over DC instructions about getting chest xray and blood work. Patient has been keeping his oxygen at 2L even when ambulating, told him to increase it to3- 4L as needed when ambulating as per order to see if that assists in the shortness of breath and oxygen levels. Asked him to call and make appointment with Primary and follow up now that he can walk better. Reviewed with him what he would need, chemistry, CBC, and chest xray.

## 2020-09-24 ENCOUNTER — Telehealth: Payer: Self-pay | Admitting: Primary Care

## 2020-09-24 DIAGNOSIS — I82462 Acute embolism and thrombosis of left calf muscular vein: Secondary | ICD-10-CM

## 2020-09-24 MED ORDER — RIVAROXABAN 20 MG PO TABS: 20.0000 mg | ORAL_TABLET | Freq: Every day | ORAL | 0 refills | Status: DC

## 2020-09-24 NOTE — Telephone Encounter (Signed)
Changed appointment pt aware

## 2020-09-24 NOTE — Telephone Encounter (Signed)
Access nurse called in with patient on line requesting appointment. Upon speaking with patient he stated there was a miscommunication between him and who he spoke with. He stated he was positive for covid not too long ago and did develop covid pneumonia. Patient used to not be able to walk 5 feet without getting SOB, but now can do all daily activities without getting too winded. Patient would just like to notify PCP to see what they recommend but he does have appointment for 10/18. Patient is wanting PCP to fill xarelto that he was prescribed in hospital as there is a message on there to request it from PCP on day 22. Please advise if possible or if wanting sooner appointment than 18th.

## 2020-09-24 NOTE — Telephone Encounter (Signed)
Noted and appreciate the update. Prescription for Xarelto 20 mg sent to pharmacy.  Based off of hospital notes he will need this for the duration of 3 months.

## 2020-09-24 NOTE — Telephone Encounter (Signed)
I spoke with pt; pt said he is doing a lot better than he was; pt can now walk to the mail box, take the trash out and walk the dog and before pt had trouble walking 5 feet. Pt said once several days ago pts oxygen level was 84-86 % for short period of time and then went back into the 90s. Pt said now is oxygen level is at 97% on room air; pt does not use the oxygen during the day but at night time pt has O2 set at 4 L; pt said since hospitalized and the current time pts weight has gone from 320 lbs to 268 lbs. Pt is eating a low carb - keto diet and doing well. Pt does not use Cpap now since wt loss because his wife tells him he does not snore anymore.  Robin changed pts appt to 10/05/20 at 12 noon; pt does need a refill of the xarelto 20 mg; pt has 9 tablets left and does request refill of [facility]to [facility] to [facility]; pt is taking xarelto 20 mg taking one daily with food. UC & ED parecautions given and pt voiced understanding. See Mayra Reel NP note below.

## 2020-09-24 NOTE — Telephone Encounter (Signed)
[facility] Day - Client TELEPHONE ADVICE RECORD AccessNurse Patient Name: Eric Rios Gender: Male DOB: 1971-12-15 Age: 49 Y 11 M 17 D Return Phone Number: [phone] (Primary) Address[place] [address] Client [facility] Day - Client Client Site [facility] - Day Physician Vernona Rieger - NP Contact Type Call Who Is Calling Patient / Member / Family / Caregiver Call Type Triage / Clinical Relationship To Patient Self Return Phone Number [phone] (Primary) Chief Complaint BREATHING - shortness of breath or sounds breathless Reason for Call Symptomatic / Request for Health Information Initial Comment Caller has post covid followup scheduled. he has difficulty breathing Translation No Nurse Assessment Nurse: Isabell Jarvis, RN, [place] Date/Time (Eastern Time): 09/24/2020 11:57:38 AM Confirm and document reason for call. If symptomatic, describe symptoms. ---Caller has post COVID follow-up scheduled. he has difficulty breathing. He was first diagnosed with COVID 08/21/20. SOB started one week later. He was seen in UC X2 and was on steroids. Also was seen in the ER 08/28/20 and was admitted to the hospital for 5 days. Has not been vaccinated for COVID. No fever. Was dx with a blood clot in leg and is on Xarelto. Does the patient have any new or worsening symptoms? ---Yes Will a triage be completed? ---Yes Related visit to physician within the last 2 weeks? ---Yes Does the PT have any chronic conditions? (i.e. diabetes, asthma, this includes High risk factors for pregnancy, etc.) ---Yes List chronic conditions. ---Hx blood clot in leg Is this a behavioral health or substance abuse call? ---No Guidelines Guideline Title Affirmed Question Affirmed Notes Nurse Date/Time (Eastern Time) Breathing Difficulty History of prior "blood clot" in leg or lungs (i.e., deep vein  thrombosis, pulmonary embolism) Isabell Jarvis, RNTalbert Forest 09/24/2020 12:03:47 PM Disp. Time Lamount Cohen Time) Disposition Final User 09/24/2020 11:54:28 AM Send to Urgent Queue Leotis Shames 09/24/2020 12:09:25 PM Go to ED Now Yes Isabell Jarvis, RN, [place] PLEASE NOTE: All timestamps contained within this report are represented as [place] Standard Time. CONFIDENTIALTY NOTICE: This fax transmission is intended only for the addressee. It contains information that is legally privileged, confidential or otherwise protected from use or disclosure. If you are not the intended recipient, you are strictly prohibited from reviewing, disclosing, copying using or disseminating any of this information or taking any action in reliance on or regarding this information. If you have received this fax in error, please notify us immediately by telephone so that we can arrange for its return to [place]. Phone: [phone], Toll-Free: [phone], Fax: [phone] Page: 2 of 2 Call Id: [id number] Caller Disagree/Comply Disagree Caller Understands Yes PreDisposition Call Doctor Care Advice Given Per Guideline GO TO ED NOW: CARE ADVICE given per Breathing Difficulty (Adult) guideline. Referrals GO TO FACILITY REFUSED

## 2020-09-24 NOTE — Telephone Encounter (Signed)
Please notify patient that I would like to see him sooner. Can we schedule him for Tuesday, October 12 at the end of the session?  Does he have any of the 20 mg tablets left?  If so does he have enough to make it to the 12th or do I need to refill now?

## 2020-09-30 ENCOUNTER — Telehealth: Payer: Self-pay

## 2020-09-30 NOTE — Telephone Encounter (Signed)
[name]: Priscella Mann - PA Case ID: [id number] - Rx #: [id number] Need help? Call us at [phone] Outcome Approvedtoday CaseId:[id number];Status:Approved;Review Type:Prior Auth;Coverage Start Date:09/30/2020;Coverage End Date:09/30/2021; Drug Xarelto 20MG  tablets Form [employer] Electronic PA Form (2017 NCPDP) Original Claim Info [address] pharmacy and let them know.

## 2020-10-05 ENCOUNTER — Other Ambulatory Visit: Payer: Self-pay

## 2020-10-05 ENCOUNTER — Encounter: Payer: Self-pay | Admitting: Primary Care

## 2020-10-05 ENCOUNTER — Ambulatory Visit: Payer: Managed Care, Other (non HMO) | Admitting: Primary Care

## 2020-10-05 ENCOUNTER — Ambulatory Visit (INDEPENDENT_AMBULATORY_CARE_PROVIDER_SITE_OTHER)
Admission: RE | Admit: 2020-10-05 | Discharge: 2020-10-05 | Disposition: A | Discharge status: Home / Self Care | Payer: Managed Care, Other (non HMO) | Source: Ambulatory Visit | Attending: Primary Care | Admitting: Primary Care

## 2020-10-05 VITALS — BP 134/82 | HR 96 | Temp 98.4°F | Ht 66.0 in | Wt 274.0 lb

## 2020-10-05 DIAGNOSIS — U071 COVID-19: Secondary | ICD-10-CM | POA: Diagnosis not present

## 2020-10-05 DIAGNOSIS — G4733 Obstructive sleep apnea (adult) (pediatric): Secondary | ICD-10-CM | POA: Diagnosis not present

## 2020-10-05 DIAGNOSIS — E119 Type 2 diabetes mellitus without complications: Secondary | ICD-10-CM | POA: Diagnosis not present

## 2020-10-05 DIAGNOSIS — J1282 Pneumonia due to coronavirus disease 2019: Secondary | ICD-10-CM

## 2020-10-05 DIAGNOSIS — J9601 Acute respiratory failure with hypoxia: Secondary | ICD-10-CM

## 2020-10-05 DIAGNOSIS — I82409 Acute embolism and thrombosis of unspecified deep veins of unspecified lower extremity: Secondary | ICD-10-CM | POA: Insufficient documentation

## 2020-10-05 DIAGNOSIS — I82462 Acute embolism and thrombosis of left calf muscular vein: Secondary | ICD-10-CM | POA: Diagnosis not present

## 2020-10-05 LAB — CBC
HCT: 45 % (ref 39.0–52.0)
Hemoglobin: 14.9 g/dL (ref 13.0–17.0)
MCHC: 33 g/dL (ref 30.0–36.0)
MCV: 81.8 fl (ref 78.0–100.0)
Platelets: 203 10*3/uL (ref 150.0–400.0)
RBC: 5.5 Mil/uL (ref 4.22–5.81)
RDW: 14.8 % (ref 11.5–15.5)
WBC: 7.1 10*3/uL (ref 4.0–10.5)

## 2020-10-05 LAB — COMPREHENSIVE METABOLIC PANEL
ALT: 14 U/L (ref 0–53)
AST: 17 U/L (ref 0–37)
Albumin: 4.1 g/dL (ref 3.5–5.2)
Alkaline Phosphatase: 44 U/L (ref 39–117)
BUN: 8 mg/dL (ref 6–23)
CO2: 29 mEq/L (ref 19–32)
Calcium: 9.5 mg/dL (ref 8.4–10.5)
Chloride: 99 mEq/L (ref 96–112)
Creatinine, Ser: 0.66 mg/dL (ref 0.40–1.50)
GFR: 111.95 mL/min (ref >60.00)
Glucose, Bld: 118 mg/dL — ABNORMAL HIGH (ref 70–99)
Potassium: 3.7 mEq/L (ref 3.5–5.1)
Sodium: 137 mEq/L (ref 135–145)
Total Bilirubin: 0.6 mg/dL (ref 0.2–1.2)
Total Protein: 7.5 g/dL (ref 6.0–8.3)

## 2020-10-05 NOTE — Assessment & Plan Note (Signed)
Lungs clear on exam today. Repeat chest x-ray pending.

## 2020-10-05 NOTE — Patient Instructions (Signed)
Complete xray(s) and labs prior to leaving today. I will notify you of your results once received.  Continue to work on a healthy diet. Ensure you are consuming 64 ounces of water daily.  Start exercising when able. You should be getting 150 minutes of moderate intensity exercise weekly.  Start checking your blood sugar levels once daily. Appropriate times to check your blood sugar levels are:  -Before any meal (breakfast, lunch, dinner) -Two hours after any meal (breakfast, lunch, dinner) -Bedtime  Record your readings and notify me if you continue to consistently run at or above 150.  Please schedule a follow up appointment in 2 months for diabetes check.  It was a pleasure to see you today! Welcome back!

## 2020-10-05 NOTE — Assessment & Plan Note (Signed)
Hospital stay for complications of COVID-19 in early September 2021 reviewed.  Today he seems to be recovering well, does still experience exertional dyspnea for prolonged periods of time.  Lungs clear on exam.  I commended him on his lifestyle changes.  Repeat labs and chest x-ray today. We will complete FMLA paperwork with a start date of August 21, 2020 with a return date of November 05, 2020.  He will update if he decides to return sooner.  Continue supplemental oxygen at night until he feels that he no longer needs.  He will update.  Hospital notes, labs, imaging reviewed.

## 2020-10-05 NOTE — Assessment & Plan Note (Signed)
Noncompliant to CPAP machine for years.  Commended him on weight loss through lifestyle changes which will help sleep apnea.  Continue supplemental oxygen at night until fully recovered from COVID-19.

## 2020-10-05 NOTE — Assessment & Plan Note (Signed)
Suspected to be provoked by COVID-19 infection.  Continue Xarelto through early December 2021.  He has enough medication to complete this prescription.

## 2020-10-05 NOTE — Assessment & Plan Note (Signed)
Diagnosed by myself in 2017, patient noncompliant to treatment or follow-up as recommended.  Recent A1c of 10.5, discussed this with patient today.  He declines all medication treatment for diabetes as he is very serious about lifestyle changes.  I commended him on weight loss and encouraged him to start exercising when able.  I recommended we at least initiate Metformin, but he currently declines.  I provided him with glucose logs to start checking his glucose once daily at various times.  We will plan to see him back in the office in 2 months for repeat A1c and to discuss glucose logs.  We discussed that if he saw glucose readings of 150 or above consistently then to call me.

## 2020-10-05 NOTE — Progress Notes (Signed)
Subjective:    Patient ID: Eric Rios, male    DOB: 27-Jul-1971, 49 y.o.   MRN: [id number]  HPI  This visit occurred during the SARS-CoV-2 public health emergency.  Safety protocols were in place, including screening questions prior to the visit, additional usage of staff PPE, and extensive cleaning of exam room while observing appropriate contact time as indicated for disinfecting solutions.   [name] is a 49 year old male with a history of OSA, Type 2 Diabetes, Morbid Obesity, Covid-19 infection with hypoxia, Pneumonia due to Covid-19 who presents today to re-establish care and for hospital follow up.  He has not been seen by me since 2017.  1) Covid-19 with Hypoxia and Pneumonia: He presented to [facility] ED on 08/28/20 with a chief complaint of shortness of breath, loss of taste/smell, decrease in appetite. He tested positive for Covid-19 one week prior.   During his stay in the ED he was noted to be hypoxic with sats of 87% on room air. Labs and chest xray consistent with Covid-19. He was treated with remdesivir and Decadron and admitted for further management.   During his hospital stay he remained hypoxic with 90% on four liters per [place]. Chest xray with bilateral infiltrates of Covid-19. He was noted to have hyperglycemia which was unclear if this was baseline or from steroids. He was initiated on Levemir 15 units twice daily and Novolog 6 units with meals. He remained hyperglycemic so his Levemir was increased to 20 units BID and Novolog increased to 8 units with meals. A1C of 10.8.   He was diagnosed with a left lower extremity DVT that was suspected to be provoked from Covid-19. He was initiated on Xarelto for which he is to be taking for 3 months total.   He was discharged home on 09/01/20 with recommendations for PCP follow up, repeat CMP/CBC, repeat chest xray. He was discharged home with oxygen.   Since his discharge home he's feeling much better. He's still using  his supplemental oxygen at bedtime which is much less frequent than before.  He is very motivated to improve his lifestyle after this experience. He is compliant to his Xarelto for which he will continue until early December 2021.   He is needing FMLA paperwork completed due to his recent hospital stay. His last day of work was on August 27th and he's remained out of work since. His occupation is very physical and he doesn't feel that he's ready to return.  He feels that he may need 1 additional month in order to recover completely.  2) Type 2 Diabetes: Prior diagnosis from 2017 with A1C of 8.9 at that time. We recommended treatment and follow up for which he did not do.   Today he endorses a very unhealthy diet up until one month ago. He is very motivated to work on a healthy lifestyle given his "near death experience" from [place].   A1C from his recent hospital stay was 10.5. He was on a low carb diet in the hospital and has adopted a "Keto Diet" since. He's feeling much better since changing his diet, doesn't have any of the "bad cravings". He's cut out sugar, bread, potatoes, rice, pasta, sugary drinks.   He is not taking Metformin which was prescribed at discharge as he wants to correct his diabetes with diet alone.   He is not checking his blood sugars at home, but a family friend checked his glucose twice and it was "normal".  Wt Readings from Last 3 Encounters:  10/05/20 274 lb (124.3 kg)  08/30/20 295 lb (133.8 kg)  01/26/17 (!) 314 lb (142.4 kg)   BP Readings from Last 3 Encounters:  10/05/20 134/82  09/01/20 (!) 148/94  08/16/19 138/66    3) OSA: Diagnosed several years ago and has been non complaint to CPAP since. He is now using supplemental oxygen at night that was supplied during his hospital stay. He's been able to wean down from daily use and is going longer and longer without need.     Review of Systems  Constitutional: Positive for fatigue.       Mild fatigue  remaining  HENT: Negative for congestion.   Eyes: Negative for visual disturbance.  Respiratory: Positive for shortness of breath. Negative for cough.        Shortness of breath is improving  Cardiovascular: Negative for chest pain.  Gastrointestinal: Negative for nausea.  Endocrine: Negative for polyuria.  Genitourinary: Negative for difficulty urinating.  Musculoskeletal: Negative for arthralgias.  Skin: Negative for color change.  Neurological: Negative for dizziness, numbness and headaches.  Hematological: Negative for adenopathy.  Psychiatric/Behavioral: The patient is not nervous/anxious.        Past Medical History:  Diagnosis Date  . Chronic back pain   . Elevated blood pressure reading      Social History   Socioeconomic History  . Marital status: Married    Spouse name: Not on file  . Number of children: Not on file  . Years of education: Not on file  . Highest education level: Not on file  Occupational History  . Not on file  Tobacco Use  . Smoking status: Never Smoker  . Smokeless tobacco: Never Used  Substance and Sexual Activity  . Alcohol use: No    Alcohol/week: 0.0 standard drinks  . Drug use: No  . Sexual activity: Not on file  Other Topics Concern  . Not on file  Social History Narrative   Married.   2 children.   Works as Lobbyist.   Enjoys playing basketball, spending time with family.    Social Determinants of Health   Financial Resource Strain:   . Difficulty of Paying Living Expenses: Not on file  Food Insecurity:   . Worried About Programme researcher, broadcasting/film/video in the Last Year: Not on file  . Ran Out of Food in the Last Year: Not on file  Transportation Needs:   . Lack of Transportation (Medical): Not on file  . Lack of Transportation (Non-Medical): Not on file  Physical Activity:   . Days of Exercise per Week: Not on file  . Minutes of Exercise per Session: Not on file  Stress:   . Feeling of Stress : Not on file  Social  Connections:   . Frequency of Communication with Friends and Family: Not on file  . Frequency of Social Gatherings with Friends and Family: Not on file  . Attends Religious Services: Not on file  . Active Member of Clubs or Organizations: Not on file  . Attends Banker Meetings: Not on file  . Marital Status: Not on file  Intimate Partner Violence:   . Fear of Current or Ex-Partner: Not on file  . Emotionally Abused: Not on file  . Physically Abused: Not on file  . Sexually Abused: Not on file    History reviewed. No pertinent surgical history.  Family History  Problem Relation Age of Onset  . Hypertension Father   .  Lung cancer Father     No Known Allergies  Current Outpatient Medications on File Prior to Visit  Medication Sig Dispense Refill  . rivaroxaban (XARELTO) 20 MG TABS tablet Take 1 tablet (20 mg total) by mouth daily with supper. 60 tablet 0  . zinc gluconate 50 MG tablet Take 50 mg by mouth daily.     No current facility-administered medications on file prior to visit.    BP 134/82   Pulse 96   Temp 98.4 F (36.9 C) (Temporal)   Ht 5\' 6"  (1.676 m)   Wt 274 lb (124.3 kg)   SpO2 96%   BMI 44.22 kg/m    Objective:   Physical Exam Cardiovascular:     Rate and Rhythm: Normal rate and regular rhythm.  Pulmonary:     Effort: Pulmonary effort is normal.     Breath sounds: Normal breath sounds.  Musculoskeletal:        General: Normal range of motion.     Cervical back: Neck supple.  Skin:    General: Skin is warm and dry.  Neurological:     Mental Status: He is alert and oriented to person, place, and time.  Psychiatric:        Mood and Affect: Mood normal.            Assessment & Plan:

## 2020-10-11 ENCOUNTER — Ambulatory Visit: Payer: Managed Care, Other (non HMO) | Admitting: Primary Care

## 2020-10-12 ENCOUNTER — Telehealth: Payer: Self-pay | Admitting: Primary Care

## 2020-10-12 NOTE — Telephone Encounter (Signed)
Disability paperwork from [employer]  In [employer] in box

## 2020-10-12 NOTE — Telephone Encounter (Signed)
Completed and placed on Robins desk. 

## 2020-10-13 NOTE — Telephone Encounter (Signed)
Paperwork faxed °

## 2020-10-15 NOTE — Telephone Encounter (Signed)
Pt aware  °Copy for pt °Copy for scan °

## 2020-10-25 ENCOUNTER — Telehealth: Payer: Self-pay | Admitting: Primary Care

## 2020-10-25 NOTE — Telephone Encounter (Signed)
Patient came into office and dropped off paperwork from employer, due to last FMLA paperwork missing a page. Given to robin.

## 2020-10-25 NOTE — Telephone Encounter (Signed)
Paperwork in [employer] in box °

## 2020-10-25 NOTE — Telephone Encounter (Signed)
Paperwork faxed °

## 2020-10-25 NOTE — Telephone Encounter (Signed)
Patient came into office to drop off fmla forms and also stated that he is needing PCP to call location that delivered oxygen to his house, due to having covid pneumonia. Patient stated he will call back to give PCP number.

## 2020-10-25 NOTE — Telephone Encounter (Signed)
Signed and placed on Robin's desk.

## 2020-10-26 ENCOUNTER — Other Ambulatory Visit: Payer: Self-pay | Admitting: Primary Care

## 2020-10-26 DIAGNOSIS — I82462 Acute embolism and thrombosis of left calf muscular vein: Secondary | ICD-10-CM

## 2020-10-27 NOTE — Telephone Encounter (Signed)
Left message asking pt to call office °Please let pt know paperwork has been faxed and there is a copy here for him ° °Copy for pt °Copy for scan °

## 2020-11-30 ENCOUNTER — Other Ambulatory Visit: Payer: Self-pay | Admitting: Primary Care

## 2020-11-30 DIAGNOSIS — I82462 Acute embolism and thrombosis of left calf muscular vein: Secondary | ICD-10-CM

## 2020-12-01 NOTE — Telephone Encounter (Signed)
Pharmacy requests refill on: Xarelto 20 mg   LAST REFILL: 10/26/2020 (Q-30, R-1) LAST OV: 10/05/2020 NEXT OV: 12/06/2020 PHARMACY: [id number] [place], [place]   Too early for refill.

## 2020-12-06 ENCOUNTER — Ambulatory Visit: Payer: Managed Care, Other (non HMO) | Admitting: Primary Care

## 2021-03-23 ENCOUNTER — Other Ambulatory Visit: Payer: Self-pay | Admitting: Primary Care

## 2021-03-23 DIAGNOSIS — Z87898 Personal history of other specified conditions: Secondary | ICD-10-CM

## 2021-03-23 MED ORDER — SCOPOLAMINE 1 MG/3DAYS TD PT72: MEDICATED_PATCH | TRANSDERMAL | 0 refills | Status: AC

## 2021-09-09 ENCOUNTER — Telehealth: Payer: Self-pay | Admitting: Primary Care

## 2021-09-09 NOTE — Telephone Encounter (Signed)
The Xarelto medication is not appropriate to refill. Also, patient needs to set up a visit for CPE/follow-up.  Please schedule.

## 2021-09-13 NOTE — Telephone Encounter (Signed)
Patient was seen last in the office in October 2021. He should be followed at least annually, recommendations made to patient during his last visit.  Okay if he decides to not schedule appointment and/or leave practice. Will remove Xarelto 20 mg as he has completed this course.

## 2021-09-13 NOTE — Telephone Encounter (Signed)
Called patient refused to make appointment. Informed has to be seen every 3 years to continue to be seen by office. He has not been seen. States that he can not make appointment due to work. Would like to be removed from patient panel and noted he will find another provider that will not need to  see him so much.   Ok to remove you as PCP?

## 2023-08-15 ENCOUNTER — Telehealth: Payer: Self-pay | Admitting: Primary Care

## 2023-08-15 NOTE — Telephone Encounter (Signed)
Called number on file, person that answered stated this is no longer patients number.
